# Patient Record
Sex: Male | Born: 1950
Health system: Southern US, Community
[De-identification: ages and names within clinical notes are randomized; demographics above are authoritative.]

## PROBLEM LIST (undated history)

## (undated) DIAGNOSIS — C44301 Unspecified malignant neoplasm of skin of nose: Secondary | ICD-10-CM

## (undated) DIAGNOSIS — I10 Essential (primary) hypertension: Secondary | ICD-10-CM

## (undated) DIAGNOSIS — K209 Esophagitis, unspecified without bleeding: Secondary | ICD-10-CM

## (undated) DIAGNOSIS — E785 Hyperlipidemia, unspecified: Secondary | ICD-10-CM

## (undated) DIAGNOSIS — M1990 Unspecified osteoarthritis, unspecified site: Secondary | ICD-10-CM

## (undated) DIAGNOSIS — D126 Benign neoplasm of colon, unspecified: Secondary | ICD-10-CM

## (undated) DIAGNOSIS — K219 Gastro-esophageal reflux disease without esophagitis: Secondary | ICD-10-CM

## (undated) DIAGNOSIS — K760 Fatty (change of) liver, not elsewhere classified: Secondary | ICD-10-CM

## (undated) DIAGNOSIS — E7800 Pure hypercholesterolemia, unspecified: Secondary | ICD-10-CM

## (undated) HISTORY — DX: Unspecified osteoarthritis, unspecified site: M19.90

## (undated) HISTORY — DX: Esophagitis, unspecified: K20.9

## (undated) HISTORY — PX: COLONOSCOPY: SHX174

## (undated) HISTORY — DX: Fatty (change of) liver, not elsewhere classified: K76.0

## (undated) HISTORY — DX: Hyperlipidemia, unspecified: E78.5

## (undated) HISTORY — DX: Pure hypercholesterolemia, unspecified: E78.00

## (undated) HISTORY — DX: Esophagitis, unspecified without bleeding: K20.90

## (undated) HISTORY — PX: OTHER SURGICAL HISTORY: SHX169

## (undated) HISTORY — PX: ENDOSCOPIC LUMBAR DISCECTOMY W/ LASER: SUR438

## (undated) HISTORY — DX: Benign neoplasm of colon, unspecified: D12.6

## (undated) HISTORY — DX: Unspecified malignant neoplasm of skin of nose: C44.301

## (undated) HISTORY — PX: KNEE SURGERY: SHX244

## (undated) HISTORY — DX: Essential (primary) hypertension: I10

## (undated) HISTORY — DX: Gastro-esophageal reflux disease without esophagitis: K21.9

---

## 1973-12-09 HISTORY — PX: WISDOM TOOTH EXTRACTION: SHX21

## 2000-12-09 HISTORY — PX: BACK SURGERY: SHX140

## 2000-12-09 HISTORY — PX: OTHER SURGICAL HISTORY: SHX169

## 2000-12-29 ENCOUNTER — Encounter: Payer: Self-pay | Admitting: Emergency Medicine

## 2000-12-29 ENCOUNTER — Emergency Department (HOSPITAL_COMMUNITY): Admission: EM | Admit: 2000-12-29 | Discharge: 2000-12-29 | Payer: Self-pay | Admitting: Emergency Medicine

## 2001-02-27 ENCOUNTER — Other Ambulatory Visit: Admission: RE | Admit: 2001-02-27 | Discharge: 2001-02-27 | Payer: Self-pay | Admitting: Gastroenterology

## 2001-02-27 ENCOUNTER — Encounter (INDEPENDENT_AMBULATORY_CARE_PROVIDER_SITE_OTHER): Payer: Self-pay | Admitting: Specialist

## 2001-07-29 ENCOUNTER — Encounter: Payer: Self-pay | Admitting: Orthopedic Surgery

## 2001-07-29 ENCOUNTER — Ambulatory Visit (HOSPITAL_COMMUNITY): Admission: RE | Admit: 2001-07-29 | Discharge: 2001-07-29 | Payer: Self-pay | Admitting: Orthopedic Surgery

## 2001-08-19 ENCOUNTER — Observation Stay (HOSPITAL_COMMUNITY): Admission: RE | Admit: 2001-08-19 | Discharge: 2001-08-20 | Payer: Self-pay | Admitting: Orthopedic Surgery

## 2001-08-19 ENCOUNTER — Encounter (INDEPENDENT_AMBULATORY_CARE_PROVIDER_SITE_OTHER): Payer: Self-pay | Admitting: Specialist

## 2001-08-19 ENCOUNTER — Encounter: Payer: Self-pay | Admitting: Orthopedic Surgery

## 2001-12-16 ENCOUNTER — Encounter: Admission: RE | Admit: 2001-12-16 | Discharge: 2002-03-16 | Payer: Self-pay | Admitting: Internal Medicine

## 2002-04-27 ENCOUNTER — Encounter: Admission: RE | Admit: 2002-04-27 | Discharge: 2002-07-26 | Payer: Self-pay | Admitting: Internal Medicine

## 2005-04-26 ENCOUNTER — Ambulatory Visit: Payer: Self-pay | Admitting: Internal Medicine

## 2005-04-30 ENCOUNTER — Ambulatory Visit: Payer: Self-pay | Admitting: Internal Medicine

## 2005-05-02 ENCOUNTER — Ambulatory Visit: Payer: Self-pay | Admitting: Cardiology

## 2005-05-09 ENCOUNTER — Ambulatory Visit: Payer: Self-pay | Admitting: Gastroenterology

## 2005-05-10 ENCOUNTER — Ambulatory Visit: Payer: Self-pay | Admitting: Cardiology

## 2005-05-17 ENCOUNTER — Ambulatory Visit: Payer: Self-pay | Admitting: Gastroenterology

## 2005-05-17 ENCOUNTER — Encounter (INDEPENDENT_AMBULATORY_CARE_PROVIDER_SITE_OTHER): Payer: Self-pay | Admitting: Specialist

## 2005-05-17 ENCOUNTER — Ambulatory Visit (HOSPITAL_COMMUNITY): Admission: RE | Admit: 2005-05-17 | Discharge: 2005-05-17 | Payer: Self-pay | Admitting: Gastroenterology

## 2005-05-31 ENCOUNTER — Ambulatory Visit: Payer: Self-pay | Admitting: Physical Medicine & Rehabilitation

## 2005-05-31 ENCOUNTER — Inpatient Hospital Stay (HOSPITAL_COMMUNITY): Admission: RE | Admit: 2005-05-31 | Discharge: 2005-06-05 | Payer: Self-pay | Admitting: Orthopedic Surgery

## 2005-11-27 ENCOUNTER — Inpatient Hospital Stay (HOSPITAL_COMMUNITY): Admission: RE | Admit: 2005-11-27 | Discharge: 2005-12-02 | Payer: Self-pay | Admitting: Orthopedic Surgery

## 2007-06-09 ENCOUNTER — Ambulatory Visit: Payer: Self-pay | Admitting: Gastroenterology

## 2008-03-04 ENCOUNTER — Ambulatory Visit: Payer: Self-pay | Admitting: Cardiology

## 2008-03-04 LAB — CONVERTED CEMR LAB
BUN: 14 mg/dL (ref 6–23)
Calcium: 8.6 mg/dL (ref 8.4–10.5)
Cholesterol: 110 mg/dL (ref 0–200)
Creatinine, Ser: 1.2 mg/dL (ref 0.4–1.5)
GFR calc Af Amer: 80 mL/min
Glucose, Bld: 98 mg/dL (ref 70–99)
HDL: 16.9 mg/dL — ABNORMAL LOW (ref >39.0)
LDL Cholesterol: 54 mg/dL (ref 0–99)
Potassium: 4 meq/L (ref 3.5–5.1)
Sodium: 140 meq/L (ref 135–145)
Total Bilirubin: 0.9 mg/dL (ref 0.3–1.2)
Total CHOL/HDL Ratio: 6.5
VLDL: 39 mg/dL (ref 0–40)

## 2008-03-09 ENCOUNTER — Ambulatory Visit: Payer: Self-pay | Admitting: Cardiology

## 2008-04-08 DIAGNOSIS — K209 Esophagitis, unspecified without bleeding: Secondary | ICD-10-CM | POA: Insufficient documentation

## 2008-04-08 DIAGNOSIS — E785 Hyperlipidemia, unspecified: Secondary | ICD-10-CM | POA: Insufficient documentation

## 2008-04-08 DIAGNOSIS — K219 Gastro-esophageal reflux disease without esophagitis: Secondary | ICD-10-CM | POA: Insufficient documentation

## 2008-04-08 DIAGNOSIS — D126 Benign neoplasm of colon, unspecified: Secondary | ICD-10-CM | POA: Insufficient documentation

## 2008-09-11 ENCOUNTER — Emergency Department (HOSPITAL_COMMUNITY): Admission: EM | Admit: 2008-09-11 | Discharge: 2008-09-11 | Payer: Self-pay | Admitting: Family Medicine

## 2009-03-30 DIAGNOSIS — M1990 Unspecified osteoarthritis, unspecified site: Secondary | ICD-10-CM | POA: Insufficient documentation

## 2009-03-30 DIAGNOSIS — I10 Essential (primary) hypertension: Secondary | ICD-10-CM | POA: Insufficient documentation

## 2009-04-03 ENCOUNTER — Ambulatory Visit: Payer: Self-pay | Admitting: Cardiology

## 2009-04-03 DIAGNOSIS — I495 Sick sinus syndrome: Secondary | ICD-10-CM | POA: Insufficient documentation

## 2009-04-10 ENCOUNTER — Encounter: Payer: Self-pay | Admitting: Internal Medicine

## 2009-04-10 ENCOUNTER — Ambulatory Visit: Payer: Self-pay | Admitting: Cardiology

## 2009-05-01 ENCOUNTER — Encounter: Payer: Self-pay | Admitting: Internal Medicine

## 2009-06-16 ENCOUNTER — Ambulatory Visit: Payer: Self-pay | Admitting: Cardiology

## 2009-06-21 ENCOUNTER — Ambulatory Visit: Payer: Self-pay | Admitting: Internal Medicine

## 2009-06-23 ENCOUNTER — Encounter (INDEPENDENT_AMBULATORY_CARE_PROVIDER_SITE_OTHER): Payer: Self-pay | Admitting: *Deleted

## 2009-06-23 LAB — CONVERTED CEMR LAB
Alkaline Phosphatase: 77 units/L (ref 39–117)
Direct LDL: 71.4 mg/dL
TSH: 0.8 microintl units/mL (ref 0.35–5.50)
Total Bilirubin: 1 mg/dL (ref 0.3–1.2)
Total CHOL/HDL Ratio: 6

## 2009-10-26 ENCOUNTER — Ambulatory Visit: Payer: Self-pay | Admitting: Cardiology

## 2009-10-30 ENCOUNTER — Ambulatory Visit: Payer: Self-pay | Admitting: Internal Medicine

## 2009-10-30 LAB — CONVERTED CEMR LAB
ALT: 56 units/L — ABNORMAL HIGH (ref 0–53)
BUN: 17 mg/dL (ref 6–23)
Bilirubin, Direct: 0.1 mg/dL (ref 0.0–0.3)
CO2: 29 meq/L (ref 19–32)
Chloride: 104 meq/L (ref 96–112)
Cholesterol: 146 mg/dL (ref 0–200)
Direct LDL: 75.1 mg/dL
GFR calc non Af Amer: 72.84 mL/min (ref >60)
Glucose, Bld: 98 mg/dL (ref 70–99)
HDL: 24.5 mg/dL — ABNORMAL LOW (ref >39.00)
Potassium: 4.3 meq/L (ref 3.5–5.1)
TSH: 1.18 microintl units/mL (ref 0.35–5.50)
Total Protein: 8.1 g/dL (ref 6.0–8.3)

## 2009-12-04 ENCOUNTER — Telehealth (INDEPENDENT_AMBULATORY_CARE_PROVIDER_SITE_OTHER): Payer: Self-pay | Admitting: *Deleted

## 2010-01-19 ENCOUNTER — Ambulatory Visit: Payer: Self-pay | Admitting: Cardiology

## 2010-01-20 LAB — CONVERTED CEMR LAB
Bilirubin, Direct: 0.1 mg/dL (ref 0.0–0.3)
Cholesterol: 161 mg/dL (ref 0–200)
HDL: 28.3 mg/dL — ABNORMAL LOW (ref >39.00)
Total CHOL/HDL Ratio: 6
Total Protein: 7.5 g/dL (ref 6.0–8.3)

## 2010-01-22 ENCOUNTER — Ambulatory Visit: Payer: Self-pay | Admitting: Cardiology

## 2010-02-16 ENCOUNTER — Ambulatory Visit: Payer: Self-pay | Admitting: Cardiology

## 2010-02-16 ENCOUNTER — Encounter (INDEPENDENT_AMBULATORY_CARE_PROVIDER_SITE_OTHER): Payer: Self-pay | Admitting: *Deleted

## 2010-02-16 LAB — CONVERTED CEMR LAB
BUN: 19 mg/dL
CO2: 32 meq/L
Calcium: 9.3 mg/dL
Chloride: 105 meq/L
Cholesterol: 163 mg/dL
Creatinine, Ser: 1 mg/dL
GFR calc non Af Amer: 81.22 mL/min
Glucose, Bld: 102 mg/dL — ABNORMAL HIGH
HDL: 32.2 mg/dL — ABNORMAL LOW
LDL Cholesterol: 103 mg/dL — ABNORMAL HIGH
Potassium: 4.2 meq/L
Sodium: 140 meq/L
TSH: 1.29 u[IU]/mL
Total CHOL/HDL Ratio: 5
Triglycerides: 140 mg/dL
VLDL: 28 mg/dL

## 2010-02-19 ENCOUNTER — Ambulatory Visit: Payer: Self-pay | Admitting: Internal Medicine

## 2010-05-17 ENCOUNTER — Encounter (HOSPITAL_BASED_OUTPATIENT_CLINIC_OR_DEPARTMENT_OTHER): Admission: RE | Admit: 2010-05-17 | Discharge: 2010-07-24 | Payer: Self-pay | Admitting: Internal Medicine

## 2010-05-18 ENCOUNTER — Ambulatory Visit: Payer: Self-pay | Admitting: Cardiology

## 2010-05-21 ENCOUNTER — Ambulatory Visit: Payer: Self-pay | Admitting: Internal Medicine

## 2010-05-21 LAB — CONVERTED CEMR LAB
ALT: 19 units/L (ref 0–53)
AST: 19 units/L (ref 0–37)
Bilirubin, Direct: 0.1 mg/dL (ref 0.0–0.3)
Cholesterol: 165 mg/dL (ref 0–200)
Direct LDL: 87.8 mg/dL
Total Bilirubin: 0.7 mg/dL (ref 0.3–1.2)
Total CHOL/HDL Ratio: 6
Total Protein: 6.7 g/dL (ref 6.0–8.3)

## 2010-08-17 ENCOUNTER — Ambulatory Visit: Payer: Self-pay | Admitting: Cardiology

## 2010-08-17 LAB — CONVERTED CEMR LAB
AST: 23 units/L (ref 0–37)
Albumin: 3.9 g/dL (ref 3.5–5.2)
Direct LDL: 81 mg/dL
Total Bilirubin: 0.8 mg/dL (ref 0.3–1.2)
Triglycerides: 322 mg/dL — ABNORMAL HIGH (ref 0.0–149.0)
VLDL: 64.4 mg/dL — ABNORMAL HIGH (ref 0.0–40.0)

## 2010-08-23 ENCOUNTER — Ambulatory Visit: Payer: Self-pay | Admitting: Cardiology

## 2010-09-28 ENCOUNTER — Telehealth: Payer: Self-pay | Admitting: Cardiology

## 2010-11-19 ENCOUNTER — Ambulatory Visit: Payer: Self-pay | Admitting: Internal Medicine

## 2010-11-21 LAB — CONVERTED CEMR LAB
Albumin: 3.8 g/dL (ref 3.5–5.2)
Alkaline Phosphatase: 62 units/L (ref 39–117)
Cholesterol: 163 mg/dL (ref 0–200)
Direct LDL: 75.2 mg/dL
Total Bilirubin: 0.4 mg/dL (ref 0.3–1.2)
Total CHOL/HDL Ratio: 6
Triglycerides: 263 mg/dL — ABNORMAL HIGH (ref 0.0–149.0)
VLDL: 52.6 mg/dL — ABNORMAL HIGH (ref 0.0–40.0)

## 2010-11-22 ENCOUNTER — Ambulatory Visit: Payer: Self-pay | Admitting: Internal Medicine

## 2011-01-03 ENCOUNTER — Telehealth: Payer: Self-pay | Admitting: Gastroenterology

## 2011-01-06 LAB — CONVERTED CEMR LAB
CO2: 32 meq/L (ref 19–32)
Calcium: 9.2 mg/dL (ref 8.4–10.5)
Chloride: 104 meq/L (ref 96–112)
Creatinine, Ser: 1.1 mg/dL (ref 0.4–1.5)
GFR calc non Af Amer: 72.98 mL/min (ref >60)
Glucose, Bld: 85 mg/dL (ref 70–99)
HDL: 21.1 mg/dL — ABNORMAL LOW (ref >39.00)
Potassium: 3.9 meq/L (ref 3.5–5.1)
Sodium: 141 meq/L (ref 135–145)
Total CHOL/HDL Ratio: 8
VLDL: 75.6 mg/dL — ABNORMAL HIGH (ref 0.0–40.0)

## 2011-01-08 NOTE — Assessment & Plan Note (Signed)
Summary: rov/sp   Lipid Clinic Visit      The patient comes in today for dyslipidemia follow-up.  The patient has no complaints of chest pain, muscle aches, and muscle cramps.  He currently takes fish oil 2 capsules three times daily for his cholesterol.   His breakfast usually consists of cheerios and skim milk.  His lunch varies but he does report eating vegetable soup or a pimento cheese sandwich.  For dinner he has started eating with a neighbor who makes home cooked meals for him, which generally consist of starches and fried foods.  He usually only drinks water, green tea or gatorade.   Review of exercise habits reveals that the patient is started mountain biking about 8 miles every day approximately 10 days ago.    Lipid Management Provider  Weston Brass, PharmD and Kennieth Francois, PharmD Candidate  Current Medications (verified): 1)  Folic Acid 1 Mg Tabs (Folic Acid) .Marland Kitchen.. 1 By Mouth Once Daily 2)  Lisinopril 40 Mg Tabs (Lisinopril) .... Take One Tablet By Mouth Daily 3)  Hydrochlorothiazide 12.5 Mg Tabs (Hydrochlorothiazide) .... Take One Tablet By Mouth Daily. 4)  Omeprazole 40 Mg Cpdr (Omeprazole) .Marland Kitchen.. 1 Tab Once Daily 5)  Aspirin 81 Mg Tbec (Aspirin) .... Take One Tablet By Mouth Daily 6)  Fish Oil Concentrate 1000 Mg Caps (Omega-3 Fatty Acids) .... 4-6 Grams / Day With Food For Cholesterol.  Allergies: 1)  Niaspan (Niacin (Antihyperlipidemic))   Vital Signs:  Patient profile:   60 year old male Weight:      255.50 pounds BMI:     34.78 Pulse rate:   64 / minute Pulse rhythm:   regular BP sitting:   112 / 74  (right arm) Cuff size:   large  Nutrition Counseling: Patient's BMI is greater than 25 and therefore counseled on weight management options.  Impression & Recommendations:  Problem # 1:  HYPERLIPIDEMIA (ICD-272.4) Assessment Unchanged Long-standing history of hyperlipidemia  currently below TC goal of 200 and LDL goal of 100.  Triglycerides are elevated (322) due to  eating with neighbor at dinner and eating fried foods and starches.  Discussed addition of fenofibrate versus lifestyle changes to lower TG.  Pt prefers to attempt lifestyle changes.  Continue all medications as directed.  Recently started mountain biking 10 days ago about 8 miles per day.  Will continue this daily.  Counseled patient on limiting starches and fried foods, as well as red meats, and increasing intake of high fiber vegetables and grilled meat.  Goal is to lose 25 pounds by December 2011.  Further medications such as fenofibrate will be considered at next visit if patient is unable to bring triglycerides down.   Seen with: Kennieth Francois, PharmD Candidate  Patient Instructions: 1)  It was nice to meet you! 2)  Continue taking all your medications as prescribed. 3)  Try to limit your starches and carbohydrates (corn, peas, potatoes, pasta, rice) and increase your intake of high fiber vegetables (squash, broccoli, green beans).  Limit fried foods and red meats.   4)  Goal is to lose 25 lbs by December 2011. 5)  Continue mountain biking/exercising daily. 6)  Your appointment to draw your next labs December 12th at 8:45. 7)  Your follow-up appointment for your lipid results is December 15th at 3:30.

## 2011-01-08 NOTE — Assessment & Plan Note (Signed)
Summary: rov lippid - [facility]   [name] returns to lipid clinic today.  He states he is feeling and doing well overall.  He denies muscle aches/pains/weakness and fatigue at this time.  He states that he has been working to return to his normal dietary intake following low-fat / low-cholesterol foods.  Exercise is limited by the patient's traveling, which incidentally, limits dietary compliance.  He has not gotten back to cycling due to weather.  He states that he lost weight down to 255 pounds, and is somewhat disappointed that he now weighs today at 260 pounds.    Lipid Management Provider  Shelby Dubin, PharmD, BCPS, CPP  Current Medications (verified): 1)  Folic Acid 1 Mg Tabs (Folic Acid) .Marland Kitchen.. 1 By Mouth Once Daily 2)  Lisinopril 40 Mg Tabs (Lisinopril) .... Take One Tablet By Mouth Daily 3)  Hydrochlorothiazide 12.5 Mg Tabs (Hydrochlorothiazide) .... Take One Tablet By Mouth Daily. 4)  Omeprazole 40 Mg Cpdr (Omeprazole) .Marland Kitchen.. 1 Tab Once Daily 5)  Aspirin 81 Mg Tbec (Aspirin) .... Take One Tablet By Mouth Daily 6)  Fish Oil Concentrate 1000 Mg Caps (Omega-3 Fatty Acids) .Marland Kitchen.. 1 - 4 Grams / Day With Food For Cholesterol.  Allergies (verified): 1)  Niaspan (Niacin (Antihyperlipidemic))  Past History:  Past Medical History: Last updated: 03/30/2009 Current Problems:  OSTEOARTHRITIS (ICD-715.90) HYPERTENSION (ICD-401.9) ESOPHAGITIS (ICD-530.10) COLONIC POLYPS, HYPERPLASTIC (ICD-211.3) GERD (ICD-530.81) HYPERLIPIDEMIA (ICD-272.4)  Postoperative cellulitis.  Past Surgical History: Last updated: 03/30/2009  right total knee arthroplasty Lumbar discectomy in 2002.  Arthroscopic scopes in the 80s. Open knee cartilage repair in the 70s.  Wisdom tooth in 1975. microdiskectomy on his lumbar spine in 2002.  Family History: Last updated: 02/03/10 Mother deceased with a history of an enlarged  heart, hypertension, diabetes, and colon cancer.  Grandfather deceased with  colon  cancer.  Risk Factors: Alcohol Use: 0 (10/30/2009)  Family History: Mother deceased with a history of an enlarged  heart, hypertension, diabetes, and colon cancer.  Grandfather deceased with  colon cancer.   Vital Signs:  Patient profile:   60 year old male Height:      72 inches Weight:      260 pounds Resp:     16 per minute  Impression & Recommendations:  Problem # 1:  HYPERLIPIDEMIA (ICD-272.4) Patient has been compliant with fish oil therapy since our last visit.  Lab values reflect total cholesterol is 161, triglycerides 419 (carbs intake has been increased in past month per patient), HDL 28.3, Direct LDL 72.1.  LFTs are within normal limits.  Patient seems resistant to initiating fibric acid derivatives or statin today.  He believes that his dietary indiscretions are responsible for TG elevation.  He requests 1 month to lower on "his own", and will increase fish oil to 2 grams two times a day in the meantime.  In one month, he will agree to additional therapy if not improved.  I have reviewed risks/benefits with patient.  I appreciate the opportunity to see him.

## 2011-01-08 NOTE — Assessment & Plan Note (Signed)
Summary: rov/tm   Visit Type:  Follow-up  CC:  dyslipidemia follow-up.   Lipid Clinic Visit      The patient comes in today for dyslipidemia follow-up.  The patient has no complaints of chest pain, muscle aches, and muscle cramps.  He currently takes fish oil 4gm daily for his cholesterol.   Dietary compliance review reveals pt does eat a low-fat, low-carbohydrate most often but does sometimes stray away from this.  His breakfast usually consists of cheerios and skim milk.  His lunch varies but he does report eating fast food on occassion.  For dinner he has started eating with a neighbor who makes home cooked meals for him.  He usually only drinks water, green tea or gatorade.   Review of exercise habits reveals that the patient is not exercising currently.  Prior to his last lipid clinic appt, he was mountain biking around 3 -5 times a week for about an hour.  He has not done this since April due to time constraints and family responsibilities.     Lipid Management Provider  Weston Brass, PharmD  Allergies: 1)  Niaspan (Niacin (Antihyperlipidemic))   Vital Signs:  Patient profile:   60 year old male Weight:      253 pounds Pulse rate:   70 / minute BP sitting:   124 / 84  (right arm) Cuff size:   regular  Impression & Recommendations:  Problem # 1:  HYPERLIPIDEMIA (ICD-272.4) Assessment Deteriorated Since last visit, pt's cholesterol has deteriorated.  His TG have increased to 246, HDL decreased to 25, but his LDL did improve to 87.8.  Pt reports the biggest change since last visit was lack of exercise.  When looking back at his flowsheet, when he exercises, his TG are lower and HDL is higher.  Encouraged him to restart mountain biking most days of the week.  Asked him to set a weight loss goal of 10 lbs before the next visit.  We will continue fish oil 4 gm daily.  If his TG are still elevated despite exercise, will consider addition of fibrate.  Will recheck his cholesterol in 3  months.   Patient Instructions: 1)  Continue fish oil 4 capsules daily 2)  Continue low fat, low cholesterol diet 3)  Start riding your mountain bike 4-5 days a week for 1 hour 4)  Goal: 10 lb weight loss by next visit.  5)  Labs at [facility]: 9/9 at 8:30 (fasting) 6)  Lipid Clinic Appt: 9/12 at 3:30

## 2011-01-08 NOTE — Letter (Signed)
Summary: Custom - Lipid  [facility], Main Office  [address]   [place], [place] [address]   Phone: [phone]  Fax: [phone]     February 16, 2010 MRN: [id number]   [facility] [address] [place], [place]  [address]   Dear Mr. Kilroy,  We have reviewed your cholesterol results.  They are as follows:     Total Cholesterol:    163 (Desirable: less than 200)       HDL  Cholesterol:     32.20  (Desirable: greater than 40 for men and 50 for women)       LDL Cholesterol:       103  (Desirable: less than 100 for low risk and less than 70 for moderate to high risk)       Triglycerides:       140.0  (Desirable: less than 150)  Our recommendations include:These numbers look good. Continue on the same medicine. Sodium, potassium, kidney and thyroid  function are normal. Take care, Dr. Darel Hong.    Call our office at the number listed above if you have any questions.  Lowering your LDL cholesterol is important, but it is only one of a large number of "risk factors" that may indicate that you are at risk for heart disease, stroke or other complications of hardening of the arteries.  Other risk factors include:   A.  Cigarette Smoking* B.  High Blood Pressure* C.  Obesity* D.   Low HDL Cholesterol (see yours above)* E.   Diabetes Mellitus (higher risk if your is uncontrolled) F.  Family history of premature heart disease G.  Previous history of stroke or cardiovascular disease    *These are risk factors YOU HAVE CONTROL OVER.  For more information, visit .  There is now evidence that lowering the TOTAL CHOLESTEROL AND LDL CHOLESTEROL can reduce the risk of heart disease.  The American Heart Association recommends the following guidelines for the treatment of elevated cholesterol:  1.  If there is now current heart disease and less than two risk factors, TOTAL CHOLESTEROL should be less than 200 and LDL CHOLESTEROL should be less than 100. 2.   If there is current heart disease or two or more risk factors, TOTAL CHOLESTEROL should be less than 200 and LDL CHOLESTEROL should be less than 70.  A diet low in cholesterol, saturated fat, and calories is the cornerstone of treatment for elevated cholesterol.  Cessation of smoking and exercise are also important in the management of elevated cholesterol and preventing vascular disease.  Studies have shown that 30 to 60 minutes of physical activity most days can help lower blood pressure, lower cholesterol, and keep your weight at a healthy level.  Drug therapy is used when cholesterol levels do not respond to therapeutic lifestyle changes (smoking cessation, diet, and exercise) and remains unacceptably high.  If medication is started, it is important to have you levels checked periodically to evaluate the need for further treatment options.  Thank you,    [employer] Team

## 2011-01-08 NOTE — Progress Notes (Signed)
Summary: refill request   Phone Note Refill Request Message from:  Patient on September 28, 2010 2:11 PM  Refills Requested: Medication #1:  LISINOPRIL 40 MG TABS Take one tablet by mouth daily  Medication #2:  HYDROCHLOROTHIAZIDE 12.5 MG TABS Take one tablet by mouth daily.  Medication #3:  OMEPRAZOLE 40 MG CPDR 1 tab once daily  Medication #4:  FOLIC ACID 1 MG TABS 1 by mouth once daily folic acid should be 1 twice a day-[facility] [address]   Method Requested: Telephone to Pharmacy Initial call taken by: Glynda Jaeger,  September 28, 2010 2:13 PM  Follow-up for Phone Call        only refilling cardiac meds...Marland KitchenMarland KitchenTried calling pt back but he was not in his office will try to call again to let him know he needs to get folic acid over the counter ask primary md for omeprazole Follow-up by: Kem Parkinson,  September 28, 2010 2:34 PM    Prescriptions: HYDROCHLOROTHIAZIDE 12.5 MG TABS (HYDROCHLOROTHIAZIDE) Take one tablet by mouth daily.  #30 x 12   Entered by:   Kem Parkinson   Authorized by:   Ferman Hamming, MD, [facility]   Signed by:   Kem Parkinson on 09/28/2010   Method used:   [facility]  [address] [id number]       [address]       [place], [place]  [address]       Ph: [phone] or [phone]       Fax: [phone]   RxID:   [id number] LISINOPRIL 40 MG TABS (LISINOPRIL) Take one tablet by mouth daily  #30 x 12   Entered by:   Kem Parkinson   Authorized by:   Ferman Hamming, MD, [facility]   Signed by:   Kem Parkinson on 09/28/2010   Method used:   [facility]  [address] [id number]       [address]       [place], [place]  [address]       Ph: [phone] or [phone]       Fax: [phone]   RxID:   [id number]

## 2011-01-09 ENCOUNTER — Telehealth: Payer: Self-pay | Admitting: Gastroenterology

## 2011-01-10 NOTE — Assessment & Plan Note (Signed)
Summary: rov/sp    Lipid Clinic Visit      Pleasant pt comes in today for dyslipidemia follow-up.  He has no complaints of chest pain, muscle aches, and muscle cramps.  He currently takes fish oil 2 capsules three times daily for his cholesterol.   Pt has been striving for weight loss and had increased exercise at last visit by mountain biking daily. Today pt reports that he has been traveling a lot the past couple of months and has not been able to exercise. Additionally he reports he has been eating out for all meals while traveling.  Pt acknowledges his diet and exercise have deteriorated since his last visit, but he still has the same goals as previously.   Lipid Management Provider  Reina Fuse, PharmD  Preventive Screening-Counseling & Management  Alcohol-Tobacco     Smoking Status: quit     Year Quit: 2002  Caffeine-Diet-Exercise     Caffeine use/day: 6 cups/day  Allergies: 1)  Niaspan (Niacin (Antihyperlipidemic))  Social History: Smoking Status:  quit Caffeine use/day:  6 cups/day   Vital Signs:  Patient profile:   60 year old male Height:      72 inches Weight:      258.75 pounds BMI:     35.22 BP sitting:   110 / 78  (left arm)  Impression & Recommendations:  Problem # 1:  HYPERLIPIDEMIA (ICD-272.4) Assessment Improved TG improved since last visit but still not at goal. Lipid panel reveals: TC 163 (Goal<200), TG 263 (Goal<150), HDL 28.5 (Goal>40), LDL 75.2 (Goal<100). Continue taking fish oil 4-6 caps (4000-6000 mg) daily. Pt acknowledges diet and exercise have deteriorated since last visit; however, he still has the same weight loss and health goals. Pt to begin mountain biking again once travel slows down in February. Provided pt with Food in the [employer] and discussed choosing healthier options while eating out. Recommend substituting subs/sandwiches for burgers and fries. Pt would like to continue diet and exercise to control TG and HDL rather than take  medications. Pt to f/u with lipid clinic in 6 months.   Patient Instructions: 1)  1.) Continue taking fish oil 4-6 capsules daily. 2)  2.) Try to maker healthier food choices while traveling. 3)  3.) Continue weight loss goal of 10 lbs by next visit (goal 25 lbs) 4)  4.) Start back to mountain back as travel will allow. 5)  5.) Return to clinic in 6 months.

## 2011-01-10 NOTE — Progress Notes (Signed)
Summary: refills  Medications Added OMEPRAZOLE 40 MG CPDR (OMEPRAZOLE) 1 tab once daily       Phone Note Call from Patient Call back at Home Phone [phone]   Caller: Patient Call For: Dr Arlyce Dice Reason for Call: Refill Medication, Talk to Nurse Summary of Call: Partient needs refills for Omeprazole [facility] [address] Initial call taken by: Tawni Levy,  January 03, 2011 11:10 AM  Follow-up for Phone Call        Dr Arlyce Dice, This pt has not been seen since 2008. Can he have his refills? Follow-up by: Merri Ray CMA Duncan Dull),  January 03, 2011 2:57 PM  Additional Follow-up for Phone Call Additional follow up Details #1::        ok but recommend OV Additional Follow-up by: Louis Meckel MD,  January 03, 2011 5:16 PM    Additional Follow-up for Phone Call Additional follow up Details #2::    Called pt to inform that he needs to call us back to schedule an office appointment before any further refills. L/M for pt. Follow-up by: Merri Ray CMA Duncan Dull),  January 04, 2011 9:46 AM  New/Updated Medications: OMEPRAZOLE 40 MG CPDR (OMEPRAZOLE) 1 tab once daily Prescriptions: OMEPRAZOLE 40 MG CPDR (OMEPRAZOLE) 1 tab once daily  #3 x 0   Entered by:   Merri Ray CMA (AAMA)   Authorized by:   Louis Meckel MD   Signed by:   Merri Ray CMA (AAMA) on 01/04/2011   Method used:   [facility]  [address] [id number]       [address]       [place], [place]  [address]       Ph: [phone] or [phone]       Fax: [phone]   RxID:   [id number]

## 2011-01-16 ENCOUNTER — Encounter (INDEPENDENT_AMBULATORY_CARE_PROVIDER_SITE_OTHER): Payer: Self-pay | Admitting: *Deleted

## 2011-01-16 NOTE — Progress Notes (Signed)
Summary: ? re appt   Phone Note Call from Patient Call back at Home Phone [phone]   Caller: Patient Call For: Dr Arlyce Dice Reason for Call: Talk to Nurse Summary of Call: Patient was told to sch an appt before he can get more refills but theres nothing available. Can he get worked in or can he get refills until the appt calendar comes out. Initial call taken by: Tawni Levy,  January 09, 2011 11:26 AM  Follow-up for Phone Call        Explained to pt that we have no available spots and the schedule is not out for the middle of March yet. Told pt to call back to schedule middle of Feb. Will continue to refill his meds until he can get an appointment Follow-up by: Merri Ray CMA Duncan Dull),  January 09, 2011 4:11 PM

## 2011-01-24 NOTE — Letter (Signed)
Summary: New Patient letter  [facility]  [address] [place], [place] [address]   Phone: [phone]  Fax: [phone]       01/16/2011 MRN: [id number]  [facility] [address] [place], [place]  [address]  Dear [name],  Welcome to the Gastroenterology Division at [facility].    You are scheduled to see Dr.  Melvia Heaps on February 28, 2011 at 10:45am on the 3rd floor at [facility], [address]  We ask that you try to arrive at our office 15 minutes prior to your appointment time to allow for check-in.  We would like you to complete the enclosed self-administered evaluation form prior to your visit and bring it with you on the day of your appointment.  We will review it with you.  Also, please bring a complete list of all your medications or, if you prefer, bring the medication bottles and we will list them.  Please bring your insurance card so that we may make a copy of it.  If your insurance requires a referral to see a specialist, please bring your referral form from your primary care physician.  Co-payments are due at the time of your visit and may be paid by cash, check or credit card.     Your office visit will consist of a consult with your physician (includes a physical exam), any laboratory testing he/she may order, scheduling of any necessary diagnostic testing (e.g. x-ray, ultrasound, CT-scan), and scheduling of a procedure (e.g. Endoscopy, Colonoscopy) if required.  Please allow enough time on your schedule to allow for any/all of these possibilities.    If you cannot keep your appointment, please call [phone] to cancel or reschedule prior to your appointment date.  This allows [place] the opportunity to schedule an appointment for another patient in need of care.  If you do not cancel or reschedule by 5 p.m. the business day prior to your appointment date, you will be charged a $50.00 late cancellation/no-show fee.    Thank  you for choosing [facility] for your medical needs.  We appreciate the opportunity to care for you.  Please visit [place] at our website  to learn more about our practice.                     Sincerely,                                                             The Gastroenterology Division

## 2011-01-30 ENCOUNTER — Telehealth: Payer: Self-pay | Admitting: Gastroenterology

## 2011-02-05 NOTE — Progress Notes (Signed)
Summary: rx refill  Medications Added OMEPRAZOLE 40 MG CPDR (OMEPRAZOLE) 1 tab once daily       Phone Note Call from Patient Call back at Home Phone [phone]   Caller: Patient Call For: Dr Arlyce Dice Reason for Call: Refill Medication, Talk to Nurse Summary of Call: Patient needs another rx for Omeprazole [facility] Initial call taken by: Tawni Levy,  January 30, 2011 9:56 AM  Follow-up for Phone Call        Medication sent to pharmacy Follow-up by: Merri Ray CMA Duncan Dull),  January 30, 2011 11:33 AM    New/Updated Medications: OMEPRAZOLE 40 MG CPDR (OMEPRAZOLE) 1 tab once daily Prescriptions: OMEPRAZOLE 40 MG CPDR (OMEPRAZOLE) 1 tab once daily  #30 x 1   Entered by:   Merri Ray CMA (AAMA)   Authorized by:   Louis Meckel MD   Signed by:   Merri Ray CMA (AAMA) on 01/30/2011   Method used:   [facility]  [address] [id number]       [address]       [place], [place]  [address]       Ph: [phone] or [phone]       Fax: [phone]   RxID:   [id number]

## 2011-02-28 ENCOUNTER — Ambulatory Visit (INDEPENDENT_AMBULATORY_CARE_PROVIDER_SITE_OTHER): Payer: 59 | Admitting: Gastroenterology

## 2011-02-28 ENCOUNTER — Encounter: Payer: Self-pay | Admitting: Gastroenterology

## 2011-02-28 DIAGNOSIS — K209 Esophagitis, unspecified without bleeding: Secondary | ICD-10-CM

## 2011-02-28 DIAGNOSIS — Z800 Family history of malignant neoplasm of digestive organs: Secondary | ICD-10-CM | POA: Insufficient documentation

## 2011-02-28 DIAGNOSIS — D126 Benign neoplasm of colon, unspecified: Secondary | ICD-10-CM

## 2011-02-28 MED ORDER — PEG-KCL-NACL-NASULF-NA ASC-C 100 G PO SOLR: 1.0000 | Freq: Once | ORAL | Status: AC

## 2011-02-28 MED ORDER — OMEPRAZOLE 40 MG PO CPDR: 40.0000 mg | DELAYED_RELEASE_CAPSULE | Freq: Every day | ORAL | Status: DC

## 2011-02-28 NOTE — Assessment & Plan Note (Signed)
Index  polypectomy for adenomatous polyp 2002.   Hyperplastic polyp 2006.   Recommendations #1  colonoscopy

## 2011-02-28 NOTE — Patient Instructions (Addendum)
Colonoscopy A colonoscopy is an exam to evaluate your entire colon. In this exam, your colon is cleansed. A long fiberoptic tube is inserted through your rectum and into your colon. The fiberoptic scope (endoscope) is a long bundle of enclosed and very flexible fibers. These fibers transmit light to the area examined and send images from that area to your caregiver. Discomfort is usually minimal. You may be given a drug to help you sleep (sedative) during or prior to the procedure. This exam helps to detect lumps (tumors), polyps, inflammation, and areas of bleeding. Your caregiver may also take a small piece of tissue (biopsy) that will be examined under a microscope. BEFORE THE PROCEDURE  A clear liquid diet may be required for 2 days before the exam.   Liquid injections (enemas) or laxatives may be required.   A large amount of electrolyte solution may be given to you to drink over a short period of time. This solution is used to clean out your colon.   You should be present one  prior to your procedure or as directed by your caregiver.   Check in at the admissions desk to fill out necessary forms if not preregistered. There will be consent forms to sign prior to the procedure. If accompanied by friends or family, there is a waiting area for them while you are having your procedure.  LET YOUR CAREGIVER KNOW ABOUT:  Allergies to food or medicine.  Medicines taken, including vitamins, herbs, eyedrops, over-the-counter medicines, and creams.   Use of steroids (by mouth or creams).   Previous problems with anesthetics or numbing medicines.   History of bleeding problems or blood clots.  Previous surgery.   Other health problems, including diabetes and kidney problems.   Possibility of pregnancy, if this applies.   AFTER THE PROCEDURE  If you received a sedative and/or pain medicine, you will need to arrange for someone to drive you home.   Occasionally, there is a little blood passed  with the first bowel movement. DO NOT be concerned.  HOME CARE INSTRUCTIONS  It is not unusual to pass moderate amounts of gas and experience mild abdominal cramping following the procedure. This is due to air being used to inflate your colon during the exam. Walking or a warm pack on your belly (abdomen) may help.   You may resume all normal meals and activities after sedatives and medicines have worn off.   Only take over-the-counter or prescription medicines for pain, discomfort, or fever as directed by your caregiver. DO NOT use aspirin or blood thinners if a biopsy was taken. Consult your caregiver for medicine usage if biopsies were taken.  FINDING OUT THE RESULTS OF YOUR TEST Not all test results are available during your visit. If your test results are not back during the visit, make an appointment with your caregiver to find out the results. Do not assume everything is normal if you have not heard from your caregiver or the medical facility. It is important for you to follow up on all of your test results. SEEK IMMEDIATE MEDICAL CARE IF: You pass large blood clots or fill a toilet with blood following the procedure. This may also occur 10 to 14 days following the procedure. This is more likely if a biopsy was taken.   You develop abdominal pain that keeps getting worse and cannot be relieved with medicine.  Document Released: 11/22/2000 Document Re-Released: 02/19/2010 [facility] Patient Information 2011 [place], [place]. Please call back to schedule your colonoscopy  Heartburn Heartburn is a painful, burning sensation in the chest. It may feel worse in certain positions, such as lying down or bending over. It is caused by stomach acid backing up into the tube that carries food from the mouth down to the stomach (lower esophagus).  CAUSES A number of conditions can cause or worsen heartburn, including:   Pregnancy.   Being overweight (obesity).   A condition called hiatal hernia, in  which part or all of the stomach is moved up into the chest through a weakness in the diaphragm muscle.   Alcohol.   Exercise.   Eating just before going to bed.   Overeating.   Medications, including:   Nonsteroidal anti-inflammatory drugs, such as ibuprofen and naproxen.   Aspirin.   Some blood pressure medicines, including beta-blockers, calcium channel blockers, and alpha-blockers.   Nitrates (used to treat angina).   The asthma medication Theophylline.   Certain sedative drugs.   Heartburn may be worse after eating certain foods. These heartburn-causing foods are different for different people, but may include:   Peppers.   Chocolate.   Coffee.   High-fat foods, including fried foods.   Spicy foods.   Garlic, onions.   Citrus fruits, including oranges, grapefruit, lemons and limes.   Food containing tomatoes or tomato products.   Mint.   Carbonated beverages.   Vinegar.  SYMPTOMS  Symptoms may last for a few minutes or a few hours, and can include:  Burning pain in the chest or lower throat.   Bitter taste in the mouth.   Coughing.  DIAGNOSIS If the usual treatments for heartburn do not improve your symptoms, then tests may be done to see if there is another condition present. Possible tests may include:  X-rays.   Endoscopy. This is when a tube with a light and a camera on the end is used to examine the esophagus and the stomach.   Blood, breath, or stool tests may be used to check for bacteria that cause ulcers.  TREATMENT There are a number of non-prescription medicines used to treat heartburn, including:  Antacids.   Acid reducers (also called H-2 blockers).   Proton-pump inhibitors.  HOME CARE INSTRUCTIONS  Raise the head of your bed by putting blocks under the legs.   Eat 2-3 hours before going to bed.   Stop smoking.   Try to reach and maintain a healthy weight.   Do not eat just a few very large meals. Instead, eat many  smaller meals throughout the day.   Try to identify foods and beverages that make your symptoms worse, and avoid these.   Avoid tight clothing.   Do not exercise right after eating.  SEEK IMMEDIATE MEDICAL CARE IF YOU:  Have severe chest pain that goes down your arm, or into your jaw or neck.   Feel sweaty, dizzy, or lightheaded.   Are short of breath.   Throw up (vomit) blood.   Have difficulty or pain with swallowing.   Have bloody or black, tarry stools.   Have bouts of heartburn more than three times a week for more than two weeks.  Document Released: 04/13/2009 Document Re-Released: 02/19/2010 [facility] Patient Information 2011 [place], [place].

## 2011-02-28 NOTE — Assessment & Plan Note (Signed)
Plan colonoscopy every 5 years 

## 2011-02-28 NOTE — Progress Notes (Signed)
History of Present Illness:   [name] is a pleasant, 60 year old white male referred at the request of Dr. Posey Rea  for followup of his GERD.   Symptoms are well-controlled with daily omeprazole. He has recurrent symptoms when he is off the medicines for several days to weeks. Endoscopy in 2007 was unrevealing.   The patient has  a history of colon polyps. Adenomatous polyps were removed in 2002. A hyperplastic polyp was removed 2006. He has no  lower GI complaints, including change of bowel habits, rectal bleeding, or abdominal pain. Family history is pertinent for his mother, who had colon cancer at age 47.    Review of Systems: Pertinent positive and negative review of systems were noted in the above HPI section. All other review of systems were otherwise negative.    Current Medications, Allergies, Past Medical History, Past Surgical History, Family History and Social History were reviewed in [employer] electronic medical record  Vital signs were reviewed in today's medical record. Physical Exam: General: Well developed , well nourished, no acute distress Head: Normocephalic and atraumatic Eyes:  sclerae anicteric, EOMI Ears: Normal auditory acuity Mouth: No deformity or lesions Lungs: Clear throughout to auscultation Heart: Regular rate and rhythm; no murmurs, rubs or bruits Abdomen: Soft, non tender and non distended. No masses, hepatosplenomegaly or hernias noted. Normal Bowel sounds Rectal:deferred Musculoskeletal: Symmetrical with no gross deformities  Pulses:  Normal pulses noted Extremities: No clubbing, cyanosis, edema or deformities noted Neurological: Alert oriented x 4, grossly nonfocal Psychological:  Alert and cooperative. Normal mood and affect

## 2011-02-28 NOTE — Assessment & Plan Note (Signed)
Patient appears to be PPI-dependent.   Recommendations #1 renew omeprazole. #2 I instructed the patient to try taking the omeprazole every other day or every third day if possible

## 2011-03-13 ENCOUNTER — Encounter: Payer: Self-pay | Admitting: *Deleted

## 2011-03-14 ENCOUNTER — Encounter: Payer: Self-pay | Admitting: Gastroenterology

## 2011-03-14 ENCOUNTER — Ambulatory Visit: Payer: 59 | Admitting: Gastroenterology

## 2011-03-14 VITALS — BP 145/88 | HR 54 | Temp 97.2°F | Resp 18 | Ht 72.0 in | Wt 260.0 lb

## 2011-03-14 DIAGNOSIS — Z800 Family history of malignant neoplasm of digestive organs: Secondary | ICD-10-CM

## 2011-03-14 DIAGNOSIS — Z1211 Encounter for screening for malignant neoplasm of colon: Secondary | ICD-10-CM

## 2011-03-14 DIAGNOSIS — Z86010 Personal history of colonic polyps: Secondary | ICD-10-CM

## 2011-03-14 DIAGNOSIS — Z8371 Family history of colonic polyps: Secondary | ICD-10-CM

## 2011-03-14 MED ORDER — SODIUM CHLORIDE 0.9 % IV SOLN: 500.0000 mL | INTRAVENOUS | Status: DC

## 2011-03-14 NOTE — Patient Instructions (Signed)
[employer] reviewed with patient and caregiver.  [facility] Discharge Instructions signed by caregiver.  Normal colonoscopy. Repeat colonoscopy in 5 years due to family history.

## 2011-03-18 ENCOUNTER — Telehealth: Payer: Self-pay | Admitting: *Deleted

## 2011-03-18 NOTE — Telephone Encounter (Signed)
Left message on voicemail.

## 2011-04-22 ENCOUNTER — Other Ambulatory Visit: Payer: Self-pay | Admitting: Internal Medicine

## 2011-04-22 ENCOUNTER — Other Ambulatory Visit: Payer: Self-pay

## 2011-04-22 DIAGNOSIS — E7800 Pure hypercholesterolemia, unspecified: Secondary | ICD-10-CM

## 2011-04-23 NOTE — Assessment & Plan Note (Signed)
[facility]                         GASTROENTEROLOGY OFFICE NOTE   NAME:[name] [name] GOLDRING                MRN:          [id number]  DATE:06/09/2007                            DOB:          [birth year]    PROBLEMS:  Reflux.   HISTORY OF PRESENT ILLNESS:  Mr. [name] [name] has returned for scheduled  follow up.  His reflux is well controlled with daily Protonix.  He  denies dysphagia, abdominal pain or nausea.  He does think that his  abdomen is protruding somewhat on the left side, and he has some  achiness on the left side.  He denies change in bowel habits.   MEDICATIONS:  1. Folate.  2. Lisinopril.  3. HCTZ.  4. Protonix on exam.   PHYSICAL EXAMINATION:  VITAL SIGNS:  Pulse 68, blood pressure 100/52,  weight 260.  ABDOMEN:  Without mass, tenderness or organomegaly.   IMPRESSION:  1. GERD, well controlled with Protonix.  2. No evidence of abdominal mass or organomegaly.   RECOMMENDATIONS:  Renew Protonix.     Barbette Hair. Arlyce Dice, MD,FACG  Electronically Signed    RDK/MedQ  DD: 06/09/2007  DT: 06/10/2007  Job #: [id number]

## 2011-04-23 NOTE — Assessment & Plan Note (Signed)
[facility]                            CARDIOLOGY OFFICE NOTE   NAME:[name], [name]                MRN:          [id number]  DATE:03/09/2008                            DOB:          November 30, 1951    Mr. Chisolm is a pleasant gentleman who has a history of hypertension  and hyperlipidemia.  He did have a Myoview in June 2005 that showed no  ischemia, and his ejection fraction was 69%.  Since I last saw him he is  doing well from a symptomatic standpoint.  He denies any dyspnea, chest  pain, palpitations, or syncope; and there is no claudication.   MEDICATIONS INCLUDE:  1. Folate.  2. Lisinopril 40 mg daily.  3. Hydrochlorothiazide 12.5 mg p.o. daily.  4. Protonix 40 mg daily.   PHYSICAL EXAM:  Shows a blood pressure of 119/78, and his pulse is 57.  His weighs 265 pounds.  HEENT:  Normal.  NECK:  Supple.  No bruits.  CHEST:  Clear.  CARDIOVASCULAR EXAM:  Regular rate.  ABDOMINAL EXAM:  Shows no tenderness.  EXTREMITIES:  Show no edema.   ELECTROCARDIOGRAM:  Shows a sinus rhythm at a rate at 57.  There are no  ST changes noted.   DIAGNOSES:  1. Hypertension. Mr. Taranto' blood pressure is adequately      controlled, and we will continue with his present dose of      lisinopril and HCT.  Note, he did have a BMET on March 04, 2008.      This showed a potassium of 4, and normal renal function.  2. Hyperlipidemia.  His most recent lipids on March 04, 2008, showed a      total cholesterol 110, an LDL of 54, and an HDL of 16.9.  His      triglycerides were 194.  He has discontinued his Niaspan in the      past.  He will resume that at 500 mg p.o. daily and increase every      4 weeks to a total of 2 g daily.  We will then check lipids and      liver and adjust as indicated.  I have asked him to take an aspirin      325 mg p.o. 30 minutes prior to taking his Niaspan.  We also      discussed the importance of diet and exercise.  Note, he  discontinued tobacco use 7 years ago.   I have asked him to follow up with his primary care physician for his  other health care needs.  Otherwise, I will see him back in 1 year.     Madolyn Frieze Jens Som, MD, [facility]  Electronically Signed    BSC/MedQ  DD: 03/09/2008  DT: 03/09/2008  Job #: [id number]

## 2011-04-25 ENCOUNTER — Ambulatory Visit: Payer: Self-pay

## 2011-04-26 NOTE — Op Note (Signed)
[facility]  Patient:    [name], [name] Visit Number: [id number] MRN: [id number]          Service Type: DSU Location: DAY Attending Physician:  Skip Mayer Dictated by:   Georges Lynch [name], M.D. Proc. Date: 08/19/01 Admit Date:  08/19/2001                             Operative Report  PREOPERATIVE DIAGNOSES: 1. Severe degenerative arthritis, both knees. 2. Herniated lumbar disc at L4-5 and L5-S1 which were in the foramen    and extra foraminal. 3. Lateral recess stenosis at L4-5 and L5-S1.  POSTOPERATIVE DIAGNOSES: 1. Severe degenerative arthritis, both knees. 2. Herniated lumbar disc at L4-5 and L5-S1 which were in the foramen    and extra foraminal. 3. Lateral recess stenosis at L4-5 and L5-S1.  SURGEON:  Georges Lynch. [name], M.D.  ASSISTANT:  Javier Docker, M.D.  DESCRIPTION OF PROCEDURE:  Prior to surgery when the patient was asleep I did sterile preps of both knees and injected both knees with 1 cc of Kenalog and 5 cc of 0.5% Marcaine.  Following this, the patient was then placed on a spinal table in the usual fashion.  Sterile prep and draping were carried out and an incision was made after two needles were placed in the back and an x-ray was taken to verify position.  Incision then was carried down to the L4-5 and L5-S1 interspaces.  Bleeders were identified and cauterized.  Note, we approached the interspaces from the right side only.  All his symptoms were on the right.  At this time, a Kocher clamp was placed on the spinous process of L4 and another x-ray was taken to verify our L4 to L5 interspaces.  We then went down and cleared the muscle from the lamina and the spinous processes. Self retaining retractors were inserted and we then utilized the bur to carry out our hemilaminectomies and to bur far out laterally at both levels at 4-5 and 5-1 on the right in order to reach the foraminal disc herniations.  At this  time, we approached the L5-S1 interspace first.  We gently retracted a nerve root, packed the area off with Thrombin soaked Gelfoam and then made a cruciate incision at the posterior longitudinal ligament and carried out our diskectomy for the herniated disc. We also went out far laterally with the hockey stick and made two other and other loose fragments noted.  All fragments were removed.  The nerve now was quite free. We also did a foraminotomy at this level as well.  Following this, we then approached the advanced microscope proximally to the L4-5 space and removed the ligamentum flavum there as well and then went out with the bur and burred out laterally and superiorly and identified a root.  We then made a cruciate incision in the posterior longitudinal ligament and completed our diskectomy. We went far out laterally as well here because of the foraminal disc.  We also utilized the hockey stick to make sure that the nerve root was free and it was.  We did foraminotomies here as well.  We also had to do partial fasciectomy.  The L4-5 space was very tight.  The nerve roots now were free.  Hemostasis was well maintained with Thrombin soaked Gelfoam.  The wound then was closed in layers in the usual fashion.  Sterile Neosporin dressing was applied.  The patient  had 1 gram of IV Ancef preoperatively. Dictated by:   Georges Lynch [name], M.D. Attending Physician:  Skip Mayer DD:  08/19/01 TD:  08/19/01 Job: [id number]

## 2011-04-26 NOTE — Op Note (Signed)
[name], [name]             ACCOUNT NO.:  [id number]   MEDICAL RECORD NO.:  [id number]          PATIENT TYPE:  INP   LOCATION:  1515                         [facility]AC[facility]   PHYSICIAN:  Georges Lynch. Gioffre, M.D.DATE OF BIRTH:  01-02-1951   DATE OF PROCEDURE:  DATE OF DISCHARGE:                                 OPERATIVE REPORT   PREOPERATIVE DIAGNOSIS:  Severe degenerative arthritis of the genu varus of  the left knee.   POSTOPERATIVE DIAGNOSIS:  Severe degenerative arthritis of the genu varus of  the left knee.   OPERATION:  Left total knee arthroplasty utilizing a DePuy rotating platform  prosthesis.  All three components were cemented.  Vancomycin was used in the  cement.   SIZES USED:  The patella was a size 41 mm in diameter.  The femoral  component was a size 5 left cruciate substituting component.  The tibial  tray was a size 5 tibial tray, cemented.  The insert was a size 5, 15 mm  thickness tibial insert.   SURGEON:  Georges Lynch. [name], M.D.   ASSISTANT:  Madlyn Frankel. Charlann Boxer, M.D.   OPERATION:  A sterile prep and drape was carried out.  The leg was  exsanguinated and esmarched.  The tourniquet was elevated at 375 mmHg.  At  this time, an incision was made over the anterior aspect of the left knee.  Bleeders were identified and cauterized.  Two flaps were created.  I then  carried out a median parapatellar incision.  The patella was reflected  laterally.  The large spurs in the patella were removed as well as the loose  body in the suprapatellar pouch.  Following this, I did a synovectomy.  I  then flexed the knee and did lateral medial meniscectomies and excised the  anterior and posterior cruciate ligaments.  Following this, an initial drill  hole was made in the intercondylar notch.  The #1 jig was inserted, and 12  mm thickness was removed from the distal femur.  Following that, a #2 jig  was inserted for a size 5 femur.  We did measure the size 5 femur.  The  appropriate cuts were then made with the third jig.  Following the  preparation of the femur, we then went down and prepared the tibia.  We  measured the tibia to be 10 mm from the lateral side, and the appropriate  tibial cut was made.  After this was done, we then sized our tray. Our tray  was a size 5 tibial tray.  Initial drill hole was made in the tibia.  We  then inserted our guide, intramedullary guide rod, and then we next used the  next jig.  It was pinned in place after the appropriate measurement was  taken with the stylette.  The size 5 tibial tray was inserted and was  applied to the tibial surface after the cut was made.  Following this, we  pinned it in place.  Our drill holes were made in the usual fashion.  Our  keel cut was then made.  After the preparation of the tibia,  we went back  and made our box cut in the femur.  We utilized our spacer block and  accepted the fact that we would need a 15 mm thickness tibial insert.  Following that, we then prepared our patella.  We measured the patella for a  41 mm diameter.  We then cut the appropriate surface off of the patella for  a resurfacing patella.  Three drill holes were made in the patella in the  usual fashion.  We then thoroughly water-picked out the knee, dried the knee  out, and cemented all three components in simultaneously.  Following that,  after the cement was hardened, we removed a trial tibial tray and then  removed all of the loose cement.  We then went back and inserted a 15 mm  thickness tibial tray.  We had excellent stability in all ranges.  We then  removed the tibial tray, water-picked the knee out thoroughly, and then  inserted our permanent tibial tray.  We cemented all three component in  simultaneously.  As mentioned, the vancomycin was used in the cement.  After  water-picking out the knee, we dried the knee, reduced the knee, and took  the knee through motion again with a permanent 15 mm thickness  tibial  insert, rotating platform type, and we had good motion with stability.  I then inserted a Hemovac drain and closed the wound in layers in the usual  fashion.  Skin was closed with metal staples.  We did use a Hemovac, as I  mentioned.  Sterile Neosporin dressings were applied.  Patient received 1 gm  of IV Ancef preop.  The patient left the operating room in satisfactory  condition.       RAG/MEDQ  D:  05/31/2005  T:  05/31/2005  Job:  [id number]

## 2011-04-26 NOTE — Op Note (Signed)
NAME:  [name], [name]             ACCOUNT NO.:  [id number]   MEDICAL RECORD NO.:  [id number]          PATIENT TYPE:  INP   LOCATION:  X001                         FACILITY:  [facility]   PHYSICIAN:  Georges Lynch. Gioffre, M.D.DATE OF BIRTH:  01-08-1951   DATE OF PROCEDURE:  11/27/2005  DATE OF DISCHARGE:                                 OPERATIVE REPORT   SURGEON:  Dr. [name]   ASSISTANT:  Arlyn Leak, PA-C.   PREOPERATIVE DIAGNOSIS:  Severe degenerative arthritis, right knee.   POSTOPERATIVE DIAGNOSIS:  Severe degenerative arthritis, right knee.  He  also had a genu varus deformity.   OPERATION:  A right total knee arthroplasty utilizing the DePuy total knee  system, the rotating platform type.  All 3 components were cemented, and  vancomycin was used in the cement.  The sizes used were as follows:  The  femoral component was a size 4 right posterior cruciate substituting  prosthesis.  The tibial tray was a size 5 tibial tray.  The tibial insert  was a rotating platform type size 4, 10 mm thickness.  The patella was a  size 41 mm with 3 pegs.   PROCEDURE:  Under general anesthesia, a routine orthopedic prep and draping  of the right lower extremity was carried out.  The patient had 1 gram of IV  Ancef preop.  At this time, sterile prep and draping of the knee was  carried.  The leg was exsanguinated with an Esmarch.  Tourniquet was  elevated at 375 mmHg.  Following this, an incision was made over the  anterior aspect of the right knee, bleeders identified and cauterized.  I  created 2 flaps, reflected those, and inserted the self-retaining retractors  and then did a median parapatellar incision and everted the patella, flexed  the knee, and did medial and lateral meniscectomies and excised the anterior  posterior cruciate ligaments.  Following that, I then removed all the spurs  from the patella, the femur, and tibia.  We then made our initial drill hole  in the intercondylar notch  and then removed 11 mm thickness off the distal  femur.  Following that, the #2 jig was inserted, and we measured this to be  a size 4 femoral component.  We then inserted our third jig and made our  anterior, posterior, and chamfering cuts in the usual fashion.  Once this  was done, we then directed attention the tibia.  We removed 4 mm thickness  off the articular surface of the tibia, measuring from the medial affected  side.  Following this, we then removed a few loose bodies in the posterior  knee.  We then measured our tray to be a size five and then following that,  we cut our keel cut out of the tibial plateau in the usual fashion.  Following that, I then made my notch cut in the distal femur in the usual  fashion.  We then inserted our trial components.  We first tried a 12 mm  thickness and a 10.  We felt the 10 mm thickness was much better because the  12.5 mm thickness prevented [place] from the complete extension of the knee.  We  then cut our patella in the usual fashion for a size 41 patella.  We did a  resurfacing type procedure on the patella.  Three drill holes were made in  the patella.  We then removed all trial components, thoroughly water picked  out the knee, and then cemented all 3 components in simultaneously using  vancomycin in the cement.  Following that, we then went through our trials  again.  We inserted a 12.5 mm thickness tibial insert.  It was too tight, so  we then went back to the 10 mm thickness permanent insert.  Note, this was  all done after we removed all loose pieces of cement in the knee.  Prior  to  inserting our trials, we utilize our spacer block and had good stability in  flexion and extension.  We thoroughly water picked out the  knee.  All 3 components were cemented in nicely.  We inserted the Hemovac  drain, irrigated the knee out again with antibiotic solution, and closed all  the layers of tissue in the usual fashion.  The skin was closed with  metal  staples, sterile Neosporin dressings applied.  The patient 1 gram of IV  Ancef preop.           ______________________________  Georges Lynch. [name], M.D.     RAG/MEDQ  D:  11/27/2005  T:  11/29/2005  Job:  [id number]

## 2011-04-26 NOTE — Discharge Summary (Signed)
NAME:  [name], [name]             ACCOUNT NO.:  [id number]   MEDICAL RECORD NO.:  [id number]          PATIENT TYPE:  INP   LOCATION:  1504                         FACILITY:  [facility]   PHYSICIAN:  Georges Lynch. Gioffre, M.D.DATE OF BIRTH:  1951-04-17   DATE OF ADMISSION:  11/27/2005  DATE OF DISCHARGE:  12/02/2005                                 DISCHARGE SUMMARY   ADMISSION DIAGNOSES:  1.  End-stage osteoarthritis, right knee.  2.  Hypertension.  3.  Reflux disease.   DISCHARGE DIAGNOSES:  1.  Right knee total arthroplasty.  2.  Postoperative cellulitis.  3.  Hypertension.  4.  Reflux disease.   HISTORY OF PRESENT ILLNESS:  Patient is a 60 year old gentleman followed by  Dr. [name] for a long period for bilateral knee pain.  He had end-stage  osteoarthritis of the bilateral knees.  He had a left total knee previous  this year with good results.  He presents with continued pain and problems  with progressively worsening ability to range of motion and ambulate on his  right knee.  Patient requests right total knee arthroplasty.   ALLERGIES:  No known drug allergies.   CURRENT MEDICATIONS:  1.  Protonix 40 mg daily.  2.  Folic acid 2 mg a day.  3.  Hydrochlorothiazide 12 mg a day.  4.  Lisinopril 40 mg a day.  5.  Aspirin 325 mg a day.   PAST SURGICAL HISTORY:  On November 27, 2005, the patient was taken to the  OR by Dr. Ranee Gosselin, assisted by Arlyn Leak, PA-C.  Under general  anesthesia, the patient underwent a right total knee arthroplasty.  Tolerated it well.  No complications.  Estimated 50 ml of blood loss.  Patient had the following components implanted:  A size 4 right femoral  component, a size 5 keel tibial tray, a size 4 10 mm polyethylene bearing, a  size 41 mm 3 peg patella.  All components were implanted with  methylmethacrylate and vancomycin.  Patient tolerated it well and was  transferred to the recovery room.   CONSULTS:  The following routine consults  were requested, physical therapy,  occupational therapy.   HOSPITAL COURSE:  On November 27, 2005, the patient was admitted to [facility] under the care of Dr. Ranee Gosselin.  Patient was taken to  the OR, where a right total knee arthroplasty was performed without any  complications.  Patient tolerated this procedure well and was transferred to  the recovery room and to the orthopedic floor in good condition.  Patient  had IV antibiotics and pain medicines on board.  He was also started on  heparin and Coumadin for DVT prophylaxis.   The patient then incurred a total of five days postoperative course on the  hospital floor, in which the patient develop some cellulitis on about postop  day #3.  He was restarted on his IV antibiotics, Ancef, for two days.  He  did improve.  He had no increased white count.  The patient was also able to  transition from IV pain medicine to p.o. meds well.  The patient worked well  on a day-to-day basis with his physical therapy.  The patient's wound  remained benign for any signs of infection.  Up until about postop day #3,  where he developed some cellulitic issues, but this did gradually improve  once he was restarted on IV antibiotics.  He had no increased white count.  The Doppler was negative for any signs of DVT.  It was felt on postop day  #5, the patient was orthopedically and medically stable and ready for  transfer home.  He was discharged in good condition with outpatient home  health.  Physical therapy arrangements made.   LABS:  CBC on December 24th was 9.5, hemoglobin 11, hematocrit 32.6,  platelets 308.  Coagulation studies on December 25th, PT 20.3 with an INR of  1.7.  Routine chemistries on December 23, sodium 135, potassium 3.5, glucose  106, BUN 11, creatinine 1.1.   Venous Doppler performed on December 24th showed no evidence of DVT,  superficial thrombus, or baker's cyst bilaterally.   DISCHARGE MEDICATIONS:  1.   Percocet 1-2 tablets every 4-6 hours p.r.n. pain.  2.  Lisinopril 40 mg daily.  3.  Folic acid 1 mg daily.  4.  Protonix 40 mg a day.  5.  Hydrochlorothiazide 12.5 mg a day.  6.  Reglan 10 mg p.o. q.6h. p.r.n.  7.  Colace 100 mg p.o. b.i.d.  8.  Heparin 5000 units subcu q.12h.  9.  Robaxin 500 mg p.o. q.6h. p.r.n.  10. Ferrous sulfate 325 mg p.o. t.i.d.   DISCHARGE INSTRUCTIONS:  1.  Diet:  No restrictions.  2.  Activity:  As tolerated.  Use of walker.  Patient may shower.  3.  Wound care:  Patient may change dressing daily.   MEDICATIONS:  Patient is to resume routine home meds with the addition of  Augmentin 875 mg 1 tablet daily x7 days, Robaxin 500 mg 1 tablet every 6  hours for muscle spasms, if needed, Percocet 1-2 tablets every 4-6 hours for  pain, Coumadin 5 mg 2 tablets a day, on Monday and Thursday, and 1-1/2  tablets on all other days.   FOLLOW UP:  1.  Patient needs a follow-up appointment with Dr. [name].  Patient is to      call for a follow-up appointment the      following week.  2.  Home health therapy with [facility] for pro times and physical      therapy.   CONDITION ON DISCHARGE:  Good.      Jamelle Rushing, P.A.    ______________________________  Georges Lynch [name], M.D.    RWK/MEDQ  D:  12/18/2005  T:  12/19/2005  Job:  [id number]

## 2011-04-26 NOTE — H&P (Signed)
[name], [name]             ACCOUNT NO.:  [id number]   MEDICAL RECORD NO.:  [id number]          PATIENT TYPE:   LOCATION:                                 FACILITY:   PHYSICIAN:  Georges Lynch. Gioffre, M.D.DATE OF BIRTH:  [birth year]   DATE OF ADMISSION:  11/27/2005  DATE OF DISCHARGE:                                HISTORY & PHYSICAL   CHIEF COMPLAINT:  Painful right knee.   HISTORY OF PRESENT ILLNESS:  The patient is a 60 year old gentleman who has  been followed by Dr. [name] for long periods of time with bilateral knee  pains.  He had end-stage osteoarthritis in bilateral knees, left was more  symptomatic than the right.  The left had a total knee arthroplasty earlier  this year with excellent results.  His right knee continues to progressively  worsen with ambulation and difficulty with range of motion.  He is in now  for a right total knee arthroplasty.   ALLERGIES:  NO KNOWN DRUG ALLERGIES.   CURRENT MEDICATIONS:  1.  Protonix 40 mg daily.  2.  Folic acid 2 mg a day.  3.  Hydrochlorothiazide 12.5 a day.  4.  Lisinopril 40 mg a day.  5.  Aspirin 325 mg p.o. q.h.s.   PAST MEDICAL HISTORY:  1.  Hypertension.  2.  Reflux disease.  3.  Osteoarthritis.   PAST SURGICAL HISTORY:  Includes:  1.  Left total knee arthroplasty in 2006.  2.  Lumbar discectomy in 2002.  3.  Arthroscopic scopes in the 80s.  4.  Open knee cartilage repair in the 70s.  5.  Wisdom tooth in 1975.  6.  The patient denies any complications from the above mentioned surgical      procedures.   SOCIAL HISTORY:  The patient is separated.  He is otherwise a healthy-  appearing and well-developed.  He denies any history of alcohol use.  He  quit smoking five years previous.  He lives in a one story house with three  steps to the main deck.   FAMILY MEDICAL HISTORY:  Mother is in her 51s with a history of an enlarged  heart, hypertension, diabetes, and colon cancer.  Grandfather deceased with  colon  cancer.   REVIEW OF SYSTEMS:  Positive for bad reflux, which is fairly well controlled  with his Protonix but otherwise he is just having complaints and problems  related to his right knee.  His left total knee is doing very well and he is  very happy.  The rest of the review of systems categories are negative and  non-contributory.   PHYSICAL EXAMINATION:  GENERAL:  The patient is a healthy appearing well-  developed 61 year old white male conscious, alert, and appropriate.  He  ambulates with a significant right-sided limp.  HEENT:  Head was normocephalic.  Pupils equal, round, and reactive.  Extra  ocular movements intact.  Sclerae is not icteric.  External ears were  without deformities.  Gross hearing is intact.  NECK:  Supple.  No palpable lymphadenopathy.  Thyroid region was non-tender.  He had good range of motion of  the cervical spine.  CHEST/LUNGS:  Sounds were clear bilaterally.  No wheezes, rales, or rhonchi.  HEART:  Regular rate and rhythm.  No murmurs, rubs, or gallops.  ABDOMEN:  Round, soft.  Bowel sounds were normoactive.  No severe region  tenderness.  EXTREMITIES:  Upper extremities are symmetric in size and shape.  He had  good range of motion of his shoulders, elbows, and wrists.  Motor strength  is 5/5.  Lower extremities right and left hip had full extension and flexion  of 130-120 degrees.  Internal and external rotation without any difficulty.  The left knee had a well-healed mid line surgical incision.  He had full  extension and flexion back to 110 degrees.  No instability.  The calf was  non-tender.  The right knee was round and boggy appearing.  Very tender  throughout.  No effusion.  No erythema.  He was 5 degrees short of full  extension.  He was able to flex it back to about 95 degrees.  He did have  some slight valgus varus laxity.  He had gross crepitance in his knee with  range of motion.  The calf was soft and non-tender.  The ankles were   symmetrical with dorsi-plantar flexion.  PERIPHERAL VASCULAR:  Carotid pulses were 2+.  No bruits.  Radial pulses  were 2+.  Posterior tibial pulses were 2+.  He had no lower extremity edema.  NEUROLOGIC:  The patient was conscious, alert, and appropriate.  Ease of  conversation.  Cranial nerves II-XII were grossly intact.  He had no gross  neurologic defects noted.  BREASTS/RECTAL/GENITOURINARY:  Deferred at this time.   IMPRESSION:  1.  End-stage osteoarthritis of the right knee.  2.  Six months status post left total knee arthroplasty.  3.  Hypertension.  4.  Reflux disease.   PLAN:  The patient will undergo all routine labs and tests prior to having a  right total knee arthroplasty by Dr. [name] at [facility] no  November 27, 2005.      Jamelle Rushing, P.A.    ______________________________  Georges Lynch [name], M.D.    RWK/MEDQ  D:  11/18/2005  T:  11/18/2005  Job:  [id number]

## 2011-04-26 NOTE — Discharge Summary (Signed)
[name], [name]             ACCOUNT NO.:  [id number]   MEDICAL RECORD NO.:  [id number]          PATIENT TYPE:  INP   LOCATION:  1515                         FACILITY:  [facility]   PHYSICIAN:  Georges Lynch. Gioffre, M.D.DATE OF BIRTH:  1951-10-14   DATE OF ADMISSION:  05/31/2005  DATE OF DISCHARGE:  06/05/2005                                 DISCHARGE SUMMARY   ADMISSION DIAGNOSES:  1.  Degenerative arthritis, left knee.  2.  Hypertension.  3.  Gastroesophageal reflux disease.   DISCHARGE DIAGNOSES:  1.  Degenerative arthritis, left knee, status post left total knee      arthroplasty.  2.  Hypertension.  3.  Gastroesophageal reflux disease.   PROCEDURES:  The patient was taken to the operating room May 31, 2005 to  undergo a left total knee arthroplasty.   SURGEON:  Worthy Rancher, M.D.   ASSISTANT:  Durene Romans, M.D.   ANESTHESIA:  Surgery was performed under general anesthesia.   CONSULTATIONS:  1.  PT/OT.  2.  Rehab.   BRIEF HISTORY AND PHYSICAL:  This patient is a 60 year old male who has had  bilateral knee pain, left greater than right, for many years.  He has known  degenerative arthritis, but his symptoms have recently gotten worse.  He has  had no relief with nonsteroidal antiinflammatory medications.  He is ready  to proceed with a total knee arthroplasty.  The patient is admitted to the  hospital for same.   LABORATORY DATA:  Admission CBC:  WBCs 6900, RBCs 4.98, hemoglobin 15.1,  hematocrit 43.9, platelet count 218,000.  Admission PT 12.3, INR 0.9, PTT  26.0.  Admission chemistry:  Sodium 137, potassium 3.5, chloride 102, CO2 27,  glucose 99, BUN 16.0, creatinine 1.3, calcium 9.5.  Total protein 7.4.  Admission urinalysis is normal.  The patient blood type is A negative.  Negative antibody screen.  Preoperative x-ray of left knee:  Severe  osteoarthritic changes, left knee.  Postoperative x-ray of left knee reveals  left total knee prosthesis in anatomic  position.  Hemoglobin and hematocrit  was followed throughout hospitalization.  He had a postoperative drop in his  hemoglobin to 9.9, but that stabilized prior to discharge.  He did not  require blood transfusion, and I am unable to locate his preoperative chest  x-ray or EKG on medical record.   HOSPITAL COURSE:  The patient was admitted to [facility].  He  underwent the above-stated procedure.  He tolerated the procedure well, and  was returned to the recovery room and then the orthopedic floor to continue  his postoperative care.  Hemovac drain was placed at the time of surgery.  The Hemovac was discontinued on postoperative day #1.  The patient was  placed on p.c. analgesics for pain control.  The patient's pain was well  controlled.  He was weaned over to oral analgesics, and the PCA was  discontinued on postoperative day #3.  The patient was a little slow to  progress, and he was felt to be a possible candidate for rehab admission.  The rehab department evaluated him, and it  was noted that he is a little too-  _________ functioning so he continued to work with the therapist, and he was  able to be discharged home on June 05, 2005.   DISCHARGE MEDICATIONS:  1.  Coumadin 5 mg daily.  2.  Percocet 10/650, 1 or 2 every 6 hours as needed for pain.  3.  Robaxin 500 mg, 1 every 6 hours as needed for spasm.   DIET:  No restrictions.   ACTIVITY:  Full weightbearing with walker.   WOUND CARE:  Daily dressing changes.  Keep dry and clean.   FOLLOW UP:  The patient is scheduled to follow up with Dr. [name] 2 weeks  from the date of surgery.   CONDITION ON DISCHARGE:  Stable.      [name]  D:  07/10/2005  T:  07/10/2005  Job:  [id number]   cc:   Georges Lynch. [name], M.D.  [facility]  [address]  [address]  [place]  [place] [address]  Fax: [phone]

## 2011-04-26 NOTE — Op Note (Signed)
NAME:  [name], [name]             ACCOUNT NO.:  [id number]   MEDICAL RECORD NO.:  [id number]          PATIENT TYPE:  INP   LOCATION:  1504                         FACILITY:  [facility]   PHYSICIAN:  Georges Lynch. Gioffre, M.D.DATE OF BIRTH:  1951-09-22   DATE OF PROCEDURE:  11/27/2005  DATE OF DISCHARGE:                                 OPERATIVE REPORT   ADDENDUM:   PREOPERATIVE DIAGNOSES:  Mild contracture of the previous left total knee.  The patient asked me if I would just manipulate that in surgery.   POSTOPERATIVE DIAGNOSES:  Mild contracture of the previous left total knee.  The patient asked me if I would just manipulate that in surgery.   OPERATION:  Closed manipulation left total knee. The procedure is under  general anesthesia. Prior to doing the right total knee I did a gentle  closed manipulation of his left total knee. We able to get him back to about  115-120 degrees.           ______________________________  Georges Lynch [name], M.D.     RAG/MEDQ  D:  11/27/2005  T:  11/29/2005  Job:  [id number]

## 2011-04-26 NOTE — H&P (Signed)
NAME:  [name], [name]             ACCOUNT NO.:  [id number]   MEDICAL RECORD NO.:  [id number]          PATIENT TYPE:  INP   LOCATION:  NA                           FACILITY:  [facility]   PHYSICIAN:  Georges Lynch. Gioffre, M.D.DATE OF BIRTH:  [birth year]   DATE OF ADMISSION:  DATE OF DISCHARGE:                                HISTORY & PHYSICAL   HISTORY:  The patient has had bilateral knee pain, left greater than right  for many years. He has had known significant degenerative arthritis but his  symptoms have recently gotten worse. He has had no relief with nonsteroidal  antiinflammatory medications and he is ready to proceed with a left total  knee arthroplasty.   ALLERGIES:  None.   PRIMARY CARE PHYSICIAN:  Georgina Quint. Plotnikov, M.D.   PAST MEDICAL HISTORY:  Significant for hypertension, gastroesophageal reflux  disease and degenerative arthritis.   CURRENT MEDICATIONS:  1.  Lisinopril 40 mg daily.  2.  Protonix 40 mg daily.  3.  Folic acid 1 mg twice daily.  4.  HCTZ 12.5 mg daily.  5.  Niaspan 1000 mg daily.   PAST SURGICAL HISTORY:  The patient had a left knee arthroscopy x2 and an  open procedure about 35 years ago. He has also had microdiskectomy on his  lumbar spine in 2002.   FAMILY HISTORY:  Mother had colon cancer.   REVIEW OF SYMPTOMS:  GENERAL:  Denies weight change, fever, chills, fatigue.  HEENT:  Denies headache, visual changes, tinnitus, hearing loss, sore  throat. CARDIOVASCULAR:  Denies chest pain, palpitations, shortness of  breath, orthopnea. PULMONARY:  Denies dyspnea, wheezing, cough, sputum  production, hemoptysis. GI:  Denies nausea, vomiting, hematemesis, or  abdominal pain. GU:  Denies dysuria, frequency, urgency, hematuria.  ENDOCRINE:  Denies polyuria, polydipsia, appetite change, heat or cold  intolerance. MUSCULOSKELETAL:  The patient has bilateral knee pain.  NEUROLOGIC:  Denies dizziness, vertigo, syncope, seizure. SKIN:  Denies  itching rashes,  masses or moles.   PHYSICAL EXAMINATION:  VITAL SIGNS:  Temperature is 98.7, pulse is 76,  respirations 18, blood pressure 120/80.  GENERAL:  A 60 year old male in no acute distress.  HEENT:  PERRL. EOM's intact. Pharynx clear.  NECK:  Supple without masses.  CHEST:  Clear to auscultation bilaterally. No wheezing, rales or rhonchi  noted.  HEART:  Regular rate and rhythm without murmur.  ABDOMEN:  Positive bowel sounds, soft, nontender.  EXTREMITIES:  Examination of his left knee reveals painful range of motion  of his left knee. He only flexes to about 90 degrees and he has a genu varus  deformity. Skin is warm and dry. X-rays of his left knee shows collapse of  the medial joint.   IMPRESSION:  Degenerative arthritis left knee.   PLAN:  The patient is to be admitted to [facility] May 31, 2005  to undergo a left total knee arthroplasty.      Haynes Hoehn  D:  05/27/2005  T:  05/27/2005  Job:  [id number]

## 2011-06-18 ENCOUNTER — Other Ambulatory Visit: Payer: Self-pay

## 2011-06-20 ENCOUNTER — Ambulatory Visit: Payer: Self-pay

## 2011-08-27 ENCOUNTER — Other Ambulatory Visit: Payer: Self-pay | Admitting: Cardiology

## 2011-09-26 ENCOUNTER — Ambulatory Visit (INDEPENDENT_AMBULATORY_CARE_PROVIDER_SITE_OTHER): Payer: 59 | Admitting: Surgery

## 2011-10-01 ENCOUNTER — Ambulatory Visit (INDEPENDENT_AMBULATORY_CARE_PROVIDER_SITE_OTHER): Payer: 59 | Admitting: General Surgery

## 2011-10-01 ENCOUNTER — Encounter (INDEPENDENT_AMBULATORY_CARE_PROVIDER_SITE_OTHER): Payer: Self-pay | Admitting: General Surgery

## 2011-10-01 VITALS — BP 134/84 | HR 60 | Temp 98.2°F | Resp 16 | Ht 72.0 in | Wt 254.6 lb

## 2011-10-01 DIAGNOSIS — K645 Perianal venous thrombosis: Secondary | ICD-10-CM

## 2011-10-01 NOTE — Progress Notes (Signed)
Subjective:     Patient ID: [name], male   DOB: May 07, 1951, 60 y.o.   MRN: [id number]  HPIMr. Pudlo is a 60 year old male laterally overweight who has a thrombosed external hemorrhoid in the right lateral side that he said was very tender and swollen approximately week ago he started having little bleeding yesterday and the area is not quite as large today she's never had any previous problems with his hemorrhoids for similar pain like this. He is on no blood thinners and had a colonoscopy in April 12   Review of Systems Current Outpatient Prescriptions  Medication Sig Dispense Refill  . aspirin 81 MG tablet Take 81 mg by mouth daily.        . Cholecalciferol (VITAMIN D3) 1000 UNITS CAPS Take by mouth. One capsule by mouth once daily       . fish oil-omega-3 fatty acids 1000 MG capsule Take 1 g by mouth. Take 4-6 capsules per day with food for cholesterol       . folic acid (FOLVITE) 1 MG tablet Take 1 mg by mouth daily. Take two tablets by mouth once daily      . hydrochlorothiazide (MICROZIDE) 12.5 MG capsule TAKE ONE TABLET BY MOUTH DAILY.  30 capsule  12  . lisinopril (PRINIVIL,ZESTRIL) 40 MG tablet TAKE ONE TABLET BY MOUTH DAILY  30 tablet  12  . omeprazole (PRILOSEC) 40 MG capsule Take 1 capsule (40 mg total) by mouth daily.  30 capsule  12   Current Facility-Administered Medications  Medication Dose Route Frequency Provider Last Rate Last Dose  . 0.9 %  sodium chloride infusion  500 mL Intravenous Continuous Louis Meckel, MD       Past Surgical History  Procedure Date  . Right total knee arthroplasty   . Endoscopic lumbar discectomy w/ laser   . Open knee cartilage repair   . Wisdom tooth extraction 1975  . Microdisctomy 2002    Lumbar spine  . Knee surgery     Bilateral , REPLACED  . Colonoscopy    Allergies  Allergen Reactions  . Niacin     REACTION: flushing       Objective:   Physical ExamBP 134/84  Pulse 60  Temp(Src) 98.2 F (36.8 C)  (Temporal)  Resp 16  Ht 6' (1.829 m)  Wt 254 lb 9.6 oz (115.486 kg)  BMI 34.53 kg/m2 Patient has a large thrombosed right external hemorrhoid that has partially drain and I recommended we go ahead and remove his humeral local anesthesia and the patient was in agreement says the pain is not being quite as bad since it started bleeding yesterday but there is still a significant amount of clot within this hemorrhoid remaining. With the patient in the right lateral done in position I could anesthetize the area after Betadine prep about 6-7 cc Xylocaine with adrenaline and then removed this order sized thrombosed hemorrhoid and the surrounding vein I did put a figure-of-eight suture of 3-0 at the apex for hemostasis the procedure was tolerated with minimal discomfort and he helped we'll not have any postoperative bleeding. He is a day tried her on his own stalk poor flow in understands not to be doing any strenuous activities for the next one to 2 days    Assessment:    excision of the large thrombosed external hemorrhoid in the right lateral quadrant with 3-0 chromic suture placed at the apex of the incision    Plan:     Followup  in approximately 2 weeks do an anoscopic exam at that time I that that he'll need any rubber bands or other treatment

## 2011-10-01 NOTE — Patient Instructions (Signed)
limited activity for lifting for the next couple of days and warm soaks in tub  twice a day started in the morning followup 2 weeks

## 2011-10-11 ENCOUNTER — Ambulatory Visit (INDEPENDENT_AMBULATORY_CARE_PROVIDER_SITE_OTHER): Payer: 59 | Admitting: Surgery

## 2011-10-16 ENCOUNTER — Ambulatory Visit (INDEPENDENT_AMBULATORY_CARE_PROVIDER_SITE_OTHER): Payer: 59 | Admitting: General Surgery

## 2011-10-16 ENCOUNTER — Encounter (INDEPENDENT_AMBULATORY_CARE_PROVIDER_SITE_OTHER): Payer: Self-pay | Admitting: General Surgery

## 2011-10-16 VITALS — BP 118/72 | HR 68 | Temp 97.4°F | Resp 16 | Ht 72.0 in | Wt 256.0 lb

## 2011-10-16 DIAGNOSIS — K645 Perianal venous thrombosis: Secondary | ICD-10-CM

## 2011-10-16 NOTE — Progress Notes (Signed)
Subjective:     Patient ID: [name], male   DOB: 1950-12-22, 60 y.o.   MRN: [id number]  HPIPatient was seen approximately 2-1/2 weeks ago when he had thrombosed external hemorrhoid he can try and install a heavy hole used on supplement and high in the straining most likely caused thrombosed examination at that time he had a large thrombosed hemorrhoid.   I removed the large external hemorrhoids and clot here in the office couple days later he said he felt fine the next day and he said no further problems since then. On examination today with videoscope was no significant internal hemorrhoids and there is no evidence of any problems at all. He'll see [place] on a p.r.n. basis and has had a previous colonoscopy and should not have any follow problems   Review of Systems     Objective:   Physical Examsee above     Assessment:     Asymptomatic now 2 weeks following excisionof a large thrombosed external hemorrhoid    Plan:     See [place] prn

## 2012-03-03 ENCOUNTER — Other Ambulatory Visit: Payer: Self-pay | Admitting: Gastroenterology

## 2012-08-22 ENCOUNTER — Other Ambulatory Visit: Payer: Self-pay | Admitting: Cardiology

## 2013-02-27 ENCOUNTER — Other Ambulatory Visit: Payer: Self-pay | Admitting: Gastroenterology

## 2013-09-29 ENCOUNTER — Other Ambulatory Visit: Payer: Self-pay | Admitting: Cardiology

## 2013-09-29 NOTE — Telephone Encounter (Signed)
Patient needs appointment for refills, no visit since 2011

## 2013-11-01 ENCOUNTER — Encounter: Payer: Self-pay | Admitting: Cardiology

## 2013-11-01 ENCOUNTER — Ambulatory Visit (INDEPENDENT_AMBULATORY_CARE_PROVIDER_SITE_OTHER): Payer: 59 | Admitting: Cardiology

## 2013-11-01 VITALS — BP 124/80 | HR 60 | Ht 72.0 in | Wt 269.0 lb

## 2013-11-01 DIAGNOSIS — E669 Obesity, unspecified: Secondary | ICD-10-CM | POA: Insufficient documentation

## 2013-11-01 DIAGNOSIS — E785 Hyperlipidemia, unspecified: Secondary | ICD-10-CM

## 2013-11-01 DIAGNOSIS — I10 Essential (primary) hypertension: Secondary | ICD-10-CM

## 2013-11-01 MED ORDER — HYDROCHLOROTHIAZIDE 12.5 MG PO CAPS: 12.5000 mg | ORAL_CAPSULE | Freq: Every day | ORAL | Status: DC

## 2013-11-01 MED ORDER — LISINOPRIL 40 MG PO TABS: 40.0000 mg | ORAL_TABLET | Freq: Every day | ORAL | Status: DC

## 2013-11-01 NOTE — Assessment & Plan Note (Signed)
-   Discussed importance of weight loss and exercise. 

## 2013-11-01 NOTE — Assessment & Plan Note (Signed)
Blood pressure controlled. Continue present medications. Check potassium and renal function. 

## 2013-11-01 NOTE — Patient Instructions (Signed)
Your physician wants you to follow-up in: ONE YEAR WITH DR Jens Som. You will receive a reminder letter in the mail two months in advance. If you don't receive a letter, please call our office to schedule the follow-up appointment.  Your physician recommends that you return for FASTING  lab work

## 2013-11-01 NOTE — Assessment & Plan Note (Signed)
Check lipids 

## 2013-11-01 NOTE — Progress Notes (Signed)
      HPI: followup hypertension and hyperlipidemia. Myoview in June of 2005 showed no ischemia and an ejection fraction of 69%. I have not seen him since 2010. He denies dyspnea, chest pain, palpitations or syncope.  Current Outpatient Prescriptions  Medication Sig Dispense Refill  . aspirin 81 MG tablet Take 81 mg by mouth daily.        . Cholecalciferol (VITAMIN D3) 1000 UNITS CAPS Take by mouth. One capsule by mouth once daily       . fish oil-omega-3 fatty acids 1000 MG capsule Take 1 g by mouth. Take 4-6 capsules per day with food for cholesterol       . folic acid (FOLVITE) 1 MG tablet Take 1 mg by mouth daily. Take two tablets by mouth once daily      . hydrochlorothiazide (MICROZIDE) 12.5 MG capsule TAKE ONE TABLET BY MOUTH DAILY.  30 capsule  0  . lisinopril (PRINIVIL,ZESTRIL) 40 MG tablet TAKE ONE TABLET BY MOUTH DAILY  30 tablet  0  . omeprazole (PRILOSEC) 40 MG capsule TAKE 1 CAPSULE (40 MG TOTAL) BY MOUTH DAILY.  30 capsule  12   Current Facility-Administered Medications  Medication Dose Route Frequency Provider Last Rate Last Dose  . 0.9 %  sodium chloride infusion  500 mL Intravenous Continuous Louis Meckel, MD         Past Medical History  Diagnosis Date  . Osteoarthrosis, unspecified whether generalized or localized, unspecified site   . Unspecified essential hypertension   . Esophagitis, unspecified   . Benign neoplasm of colon   . Esophageal reflux   . Other and unspecified hyperlipidemia   . Cancer     malignant skin cancer  . Hemorrhoids     Past Surgical History  Procedure Laterality Date  . Right total knee arthroplasty    . Endoscopic lumbar discectomy w/ laser    . Open knee cartilage repair    . Wisdom tooth extraction  1975  . Microdisctomy  2002    Lumbar spine  . Knee surgery      Bilateral , REPLACED  . Colonoscopy      History   Social History  . Marital Status: Legally Separated    Spouse Name: N/A    Number of Children: N/A  .  Years of Education: N/A   Occupational History  . Employed     Self Employed--Financial    Social History Main Topics  . Smoking status: Former Games developer  . Smokeless tobacco: Never Used  . Alcohol Use: No  . Drug Use: No  . Sexual Activity: Not on file   Other Topics Concern  . Not on file   Social History Narrative  . No narrative on file    ROS: no fevers or chills, productive cough, hemoptysis, dysphasia, odynophagia, melena, hematochezia, dysuria, hematuria, rash, seizure activity, orthopnea, PND, pedal edema, claudication. Remaining systems are negative.  Physical Exam: Well-developed obese in no acute distress.  Skin is warm and dry.  HEENT is normal.  Neck is supple.  Chest is clear to auscultation with normal expansion.  Cardiovascular exam is regular rate and rhythm.  Abdominal exam nontender or distended. No masses palpated. Extremities show no edema. neuro grossly intact  ECG sinus rhythm at a rate of 60. No ST changes.

## 2014-03-11 ENCOUNTER — Other Ambulatory Visit: Payer: Self-pay | Admitting: Gastroenterology

## 2014-04-25 ENCOUNTER — Telehealth: Payer: Self-pay | Admitting: Gastroenterology

## 2014-04-25 MED ORDER — OMEPRAZOLE 40 MG PO CPDR: 40.0000 mg | DELAYED_RELEASE_CAPSULE | Freq: Every day | ORAL | Status: DC

## 2014-04-25 NOTE — Telephone Encounter (Signed)
Refilled Omeprazole as patient has appointment scheduled next month

## 2014-05-23 ENCOUNTER — Ambulatory Visit (INDEPENDENT_AMBULATORY_CARE_PROVIDER_SITE_OTHER): Payer: 59 | Admitting: Gastroenterology

## 2014-05-23 ENCOUNTER — Encounter: Payer: Self-pay | Admitting: Gastroenterology

## 2014-05-23 VITALS — BP 100/60 | HR 60 | Ht 69.0 in | Wt 265.0 lb

## 2014-05-23 DIAGNOSIS — K219 Gastro-esophageal reflux disease without esophagitis: Secondary | ICD-10-CM

## 2014-05-23 DIAGNOSIS — Z800 Family history of malignant neoplasm of digestive organs: Secondary | ICD-10-CM

## 2014-05-23 DIAGNOSIS — D126 Benign neoplasm of colon, unspecified: Secondary | ICD-10-CM

## 2014-05-23 MED ORDER — OMEPRAZOLE 40 MG PO CPDR: 40.0000 mg | DELAYED_RELEASE_CAPSULE | Freq: Every day | ORAL | Status: DC

## 2014-05-23 NOTE — Patient Instructions (Signed)
We will renew your prescription Omeprazole

## 2014-05-23 NOTE — Progress Notes (Signed)
_                                                                                                                History of Present Illness: Pleasant 63 year old white male with history of GERD, family history of colorectal cancer, here for followup and for medication refill.  On omeprazole 40 mg daily his pyrosis is fair well-controlled.  Attempt at weaning him from PPI therapy in the past have been unsuccessful.  Last colonoscopy in 2012 was normal.  Family history is pertinent for his mother who had colon cancer.    Past Medical History  Diagnosis Date  . Osteoarthrosis, unspecified whether generalized or localized, unspecified site   . Unspecified essential hypertension   . Esophagitis, unspecified   . Benign neoplasm of colon   . Esophageal reflux   . Other and unspecified hyperlipidemia   . Skin cancer of nose     malignant skin cancer  . Hemorrhoids    Past Surgical History  Procedure Laterality Date  . Right total knee arthroplasty    . Endoscopic lumbar discectomy w/ laser    . Open knee cartilage repair    . Wisdom tooth extraction  1975  . Microdisctomy  2002    Lumbar spine  . Knee surgery      Bilateral , REPLACED  . Colonoscopy     family history includes Cancer in his mother and paternal grandfather; Colon cancer in his mother and paternal grandfather; Diabetes in his mother; Hypertension in his mother. Current Outpatient Prescriptions  Medication Sig Dispense Refill  . aspirin 81 MG tablet Take 81 mg by mouth daily.        . Cholecalciferol (VITAMIN D3) 1000 UNITS CAPS Take by mouth. One capsule by mouth once daily       . fish oil-omega-3 fatty acids 1000 MG capsule Take 1 g by mouth. Take 4-6 capsules per day with food for cholesterol       . folic acid (FOLVITE) 1 MG tablet Take 1 mg by mouth daily. Take two tablets by mouth once daily      . hydrochlorothiazide (MICROZIDE) 12.5 MG capsule Take 1 capsule (12.5 mg total) by mouth daily.  30  capsule  11  . lisinopril (PRINIVIL,ZESTRIL) 40 MG tablet Take 1 tablet (40 mg total) by mouth daily.  30 tablet  11  . omeprazole (PRILOSEC) 40 MG capsule Take 1 capsule (40 mg total) by mouth daily.  30 capsule  2   Current Facility-Administered Medications  Medication Dose Route Frequency Provider Last Rate Last Dose  . 0.9 %  sodium chloride infusion  500 mL Intravenous Continuous Inda Castle, MD       Allergies as of 05/23/2014 - Review Complete 05/23/2014  Allergen Reaction Noted  . Niacin  04/10/2009    reports that he has quit smoking. He has never used smokeless tobacco. He reports that he does not drink alcohol or use illicit drugs.     Review of Systems: Pertinent  positive and negative review of systems were noted in the above HPI section. All other review of systems were otherwise negative.  Vital signs were reviewed in today's medical record Physical Exam: General: Well developed , well nourished, no acute distress Skin: anicteric Head: Normocephalic and atraumatic Eyes:  sclerae anicteric, EOMI Ears: Normal auditory acuity Mouth: No deformity or lesions Neck: Supple, no masses or thyromegaly Lungs: Clear throughout to auscultation Heart: Regular rate and rhythm; no murmurs, rubs or bruits Abdomen: Soft, non tender and non distended. No masses, hepatosplenomegaly or hernias noted. Normal Bowel sounds Rectal:deferred Musculoskeletal: Symmetrical with no gross deformities  Skin: No lesions on visible extremities Pulses:  Normal pulses noted Extremities: No clubbing, cyanosis, edema or deformities noted Neurological: Alert oriented x 4, grossly nonfocal Cervical Nodes:  No significant cervical adenopathy Inguinal Nodes: No significant inguinal adenopathy Psychological:  Alert and cooperative. Normal mood and affect  See Assessment and Plan under Problem List

## 2014-05-23 NOTE — Assessment & Plan Note (Signed)
Patient has PPI-dependent GERD which is well-controlled with omeprazole.  Plan to continue with the same.

## 2014-05-23 NOTE — Assessment & Plan Note (Signed)
Plan to continue colonoscopy at 5 year interval is.  Next exam due 2017

## 2014-07-19 ENCOUNTER — Other Ambulatory Visit: Payer: Self-pay | Admitting: Gastroenterology

## 2015-04-25 NOTE — Progress Notes (Signed)
      HPI: followup hypertension and hyperlipidemia. Myoview in June of 2005 showed no ischemia and an ejection fraction of 69%. Since last seen, he notes increased dyspnea on exertion for several months. No orthopnea, PND, pedal edema, chest pain, palpitations or syncope.  No current outpatient prescriptions on file.   Current Facility-Administered Medications  Medication Dose Route Frequency Provider Last Rate Last Dose  . 0.9 %  sodium chloride infusion  500 mL Intravenous Continuous Inda Castle, MD         Past Medical History  Diagnosis Date  . Osteoarthrosis, unspecified whether generalized or localized, unspecified site   . Unspecified essential hypertension   . Esophagitis, unspecified   . Benign neoplasm of colon   . Esophageal reflux   . Other and unspecified hyperlipidemia   . Skin cancer of nose     malignant skin cancer  . Hemorrhoids     Past Surgical History  Procedure Laterality Date  . Right total knee arthroplasty    . Endoscopic lumbar discectomy w/ laser    . Open knee cartilage repair    . Wisdom tooth extraction  1975  . Microdisctomy  2002    Lumbar spine  . Knee surgery      Bilateral , REPLACED  . Colonoscopy      History   Social History  . Marital Status: Legally Separated    Spouse Name: N/A  . Number of Children: N/A  . Years of Education: N/A   Occupational History  . Employed     Self Employed--Financial    Social History Main Topics  . Smoking status: Former Research scientist (life sciences)  . Smokeless tobacco: Never Used  . Alcohol Use: No  . Drug Use: No  . Sexual Activity: Not on file   Other Topics Concern  . Not on file   Social History Narrative    ROS: no fevers or chills, productive cough, hemoptysis, dysphasia, odynophagia, melena, hematochezia, dysuria, hematuria, rash, seizure activity, orthopnea, PND, pedal edema, claudication. Remaining systems are negative.  Physical Exam: Well-developed obese in no acute distress.  Skin is  warm and dry.  HEENT is normal.  Neck is supple.  Chest is clear to auscultation with normal expansion.  Cardiovascular exam is regular rate and rhythm.  Abdominal exam nontender or distended. No masses palpated. Positive bruit Extremities show no edema. neuro grossly intact  ECG sinus rhythm, inferior lateral T-wave changes.

## 2015-04-26 ENCOUNTER — Ambulatory Visit (INDEPENDENT_AMBULATORY_CARE_PROVIDER_SITE_OTHER): Payer: BLUE CROSS/BLUE SHIELD | Admitting: Cardiology

## 2015-04-26 ENCOUNTER — Encounter: Payer: Self-pay | Admitting: *Deleted

## 2015-04-26 ENCOUNTER — Encounter: Payer: Self-pay | Admitting: Cardiology

## 2015-04-26 VITALS — BP 167/93 | HR 60 | Ht 69.0 in | Wt 273.0 lb

## 2015-04-26 DIAGNOSIS — E785 Hyperlipidemia, unspecified: Secondary | ICD-10-CM | POA: Diagnosis not present

## 2015-04-26 DIAGNOSIS — R0989 Other specified symptoms and signs involving the circulatory and respiratory systems: Secondary | ICD-10-CM | POA: Diagnosis not present

## 2015-04-26 DIAGNOSIS — E669 Obesity, unspecified: Secondary | ICD-10-CM | POA: Diagnosis not present

## 2015-04-26 DIAGNOSIS — R0600 Dyspnea, unspecified: Secondary | ICD-10-CM

## 2015-04-26 MED ORDER — LISINOPRIL 40 MG PO TABS: 40.0000 mg | ORAL_TABLET | Freq: Every day | ORAL | Status: DC

## 2015-04-26 MED ORDER — HYDROCHLOROTHIAZIDE 12.5 MG PO CAPS: 12.5000 mg | ORAL_CAPSULE | Freq: Every day | ORAL | Status: DC

## 2015-04-26 NOTE — Assessment & Plan Note (Signed)
Check lipids 

## 2015-04-26 NOTE — Addendum Note (Signed)
Addended by: Cristopher Estimable on: 04/26/2015 12:21 PM   Modules accepted: Orders

## 2015-04-26 NOTE — Assessment & Plan Note (Signed)
Etiology unclear. Plan nuclear study for risk stratification given family history of coronary disease. Check echocardiogram for LV systolic and diastolic function. If above negative I will refer for sleep study as he could have sleep apnea.

## 2015-04-26 NOTE — Assessment & Plan Note (Signed)
Discussed need for weight loss. 

## 2015-04-26 NOTE — Assessment & Plan Note (Signed)
Blood pressure is elevated. He has not been taking his lisinopril hydrochlorothiazide. Plan to resume at previous dose. Check potassium and renal function in 1 week.

## 2015-04-26 NOTE — Assessment & Plan Note (Signed)
Schedule ultrasound to exclude aneurysm. 

## 2015-04-26 NOTE — Patient Instructions (Signed)
Your physician recommends that you schedule a follow-up appointment in: [place] has requested that you have en exercise stress myoview. For further information please visit [email or web address]. Please follow instruction sheet, as given.   Your physician has requested that you have an echocardiogram. Echocardiography is a painless test that uses sound waves to create images of your heart. It provides your doctor with information about the size and shape of your heart and how well your heart's [place] and valves are working. This procedure takes approximately one hour. There are no restrictions for this procedure.   Your physician has requested that you have an abdominal aorta duplex. During this test, an ultrasound is used to evaluate the aorta. Allow 30 minutes for this exam. Do not eat after midnight the day before and avoid carbonated beverages   RESTART LISINOPRIL/HCTZ  Your physician recommends that you return for lab work in: [place]

## 2015-05-04 ENCOUNTER — Other Ambulatory Visit: Payer: Self-pay | Admitting: Cardiology

## 2015-05-04 ENCOUNTER — Other Ambulatory Visit: Payer: Self-pay | Admitting: *Deleted

## 2015-05-04 DIAGNOSIS — E785 Hyperlipidemia, unspecified: Secondary | ICD-10-CM

## 2015-05-04 DIAGNOSIS — Z79899 Other long term (current) drug therapy: Secondary | ICD-10-CM

## 2015-05-04 LAB — HEPATIC FUNCTION PANEL
ALBUMIN: 4.1 g/dL (ref 3.5–5.2)
ALT: 35 U/L (ref 0–53)
AST: 25 U/L (ref 0–37)
Alkaline Phosphatase: 93 U/L (ref 39–117)
Bilirubin, Direct: 0.1 mg/dL (ref 0.0–0.3)
Indirect Bilirubin: 0.4 mg/dL (ref 0.2–1.2)
Total Bilirubin: 0.5 mg/dL (ref 0.2–1.2)
Total Protein: 7.3 g/dL (ref 6.0–8.3)

## 2015-05-04 LAB — LIPID PANEL
CHOLESTEROL: 173 mg/dL (ref 0–200)
HDL: 25 mg/dL — ABNORMAL LOW (ref >=40)
LDL CALC: 101 mg/dL — AB (ref 0–99)
Total CHOL/HDL Ratio: 6.9 Ratio
Triglycerides: 234 mg/dL — ABNORMAL HIGH (ref <150)
VLDL: 47 mg/dL — AB (ref 0–40)

## 2015-05-05 ENCOUNTER — Telehealth (HOSPITAL_COMMUNITY): Payer: Self-pay

## 2015-05-05 NOTE — Telephone Encounter (Signed)
Encounter complete. 

## 2015-05-09 ENCOUNTER — Telehealth (HOSPITAL_COMMUNITY): Payer: Self-pay

## 2015-05-09 NOTE — Telephone Encounter (Signed)
Encounter complete. 

## 2015-05-10 ENCOUNTER — Ambulatory Visit (HOSPITAL_COMMUNITY)
Admission: RE | Admit: 2015-05-10 | Discharge: 2015-05-10 | Disposition: A | Discharge status: Home / Self Care | Payer: BLUE CROSS/BLUE SHIELD | Source: Ambulatory Visit | Attending: Cardiology | Admitting: Cardiology

## 2015-05-10 DIAGNOSIS — R0600 Dyspnea, unspecified: Secondary | ICD-10-CM | POA: Insufficient documentation

## 2015-05-10 LAB — MYOCARDIAL PERFUSION IMAGING
CHL CUP MPHR: 156 {beats}/min
CHL CUP RESTING HR STRESS: 57 {beats}/min
CHL CUP STRESS STAGE 1 SBP: 125 mmHg
CHL CUP STRESS STAGE 1 SPEED: 0 mph
CHL CUP STRESS STAGE 2 HR: 61 {beats}/min
CHL CUP STRESS STAGE 2 SPEED: 0.9 mph
CHL CUP STRESS STAGE 3 GRADE: 0 %
CHL CUP STRESS STAGE 3 HR: 61 {beats}/min
CHL CUP STRESS STAGE 4 SBP: 107 mmHg
CHL CUP STRESS STAGE 4 SPEED: 1.7 mph
CHL CUP STRESS STAGE 5 GRADE: 12 %
CHL CUP STRESS STAGE 5 SBP: 149 mmHg
CHL CUP STRESS STAGE 5 SPEED: 2.5 mph
CHL CUP STRESS STAGE 6 HR: 134 {beats}/min
CHL CUP STRESS STAGE 9 GRADE: 0 %
CHL CUP STRESS STAGE 9 HR: 85 {beats}/min
CHL CUP STRESS STAGE 9 SBP: 173 mmHg
CSEPED: 7 min
CSEPHR: 87 %
CSEPPMHR: 85 %
Estimated workload: 6.7 METS
Exercise duration (sec): 5 s
LVDIAVOL: 93 mL
LVSYSVOL: 47 mL
NUC STRESS EF: 50 %
Peak HR: 134 {beats}/min
RPE: 26792
SDS: 2
SRS: 3
SSS: 5
Stage 1 DBP: 78 mmHg
Stage 1 Grade: 0 %
Stage 1 HR: 62 {beats}/min
Stage 2 Grade: 0 %
Stage 3 Speed: 1 mph
Stage 4 DBP: 71 mmHg
Stage 4 Grade: 10 %
Stage 4 HR: 110 {beats}/min
Stage 5 DBP: 80 mmHg
Stage 5 HR: 134 {beats}/min
Stage 6 Grade: 12.2 %
Stage 6 Speed: 2.5 mph
Stage 7 Grade: 14 %
Stage 7 HR: 134 {beats}/min
Stage 7 Speed: 2 mph
Stage 8 DBP: 85 mmHg
Stage 8 Grade: 0 %
Stage 8 HR: 122 {beats}/min
Stage 8 SBP: 174 mmHg
Stage 8 Speed: 0 mph
Stage 9 DBP: 87 mmHg
Stage 9 Speed: 0 mph
TID: 1.09

## 2015-05-10 MED ORDER — TECHNETIUM TC 99M SESTAMIBI GENERIC - CARDIOLITE
10.4000 | Freq: Once | INTRAVENOUS | Status: AC | PRN
  Administered 2015-05-10: 10 via INTRAVENOUS

## 2015-05-10 MED ORDER — TECHNETIUM TC 99M SESTAMIBI GENERIC - CARDIOLITE
32.0000 | Freq: Once | INTRAVENOUS | Status: AC | PRN
  Administered 2015-05-10: 32 via INTRAVENOUS

## 2015-05-11 ENCOUNTER — Other Ambulatory Visit (HOSPITAL_COMMUNITY): Payer: Self-pay | Admitting: Cardiology

## 2015-05-11 ENCOUNTER — Ambulatory Visit (HOSPITAL_COMMUNITY)
Admission: RE | Admit: 2015-05-11 | Discharge: 2015-05-11 | Disposition: A | Discharge status: Home / Self Care | Payer: BLUE CROSS/BLUE SHIELD | Source: Ambulatory Visit | Attending: Cardiology | Admitting: Cardiology

## 2015-05-11 DIAGNOSIS — R0600 Dyspnea, unspecified: Secondary | ICD-10-CM | POA: Diagnosis not present

## 2015-05-11 DIAGNOSIS — R0989 Other specified symptoms and signs involving the circulatory and respiratory systems: Secondary | ICD-10-CM

## 2015-05-11 DIAGNOSIS — I10 Essential (primary) hypertension: Secondary | ICD-10-CM | POA: Diagnosis not present

## 2015-05-11 DIAGNOSIS — D126 Benign neoplasm of colon, unspecified: Secondary | ICD-10-CM

## 2015-05-11 DIAGNOSIS — E669 Obesity, unspecified: Secondary | ICD-10-CM

## 2015-05-11 DIAGNOSIS — Z800 Family history of malignant neoplasm of digestive organs: Secondary | ICD-10-CM

## 2015-05-11 DIAGNOSIS — I495 Sick sinus syndrome: Secondary | ICD-10-CM

## 2015-05-11 MED ORDER — PERFLUTREN LIPID MICROSPHERE
1.0000 mL | INTRAVENOUS | Status: AC | PRN
  Administered 2015-05-11: 2.5 mL via INTRAVENOUS

## 2015-05-11 MED ORDER — PERFLUTREN LIPID MICROSPHERE: 1.0000 mL | INTRAVENOUS | Status: AC | PRN

## 2015-05-12 ENCOUNTER — Ambulatory Visit (HOSPITAL_COMMUNITY)
Admission: RE | Admit: 2015-05-12 | Discharge: 2015-05-12 | Disposition: A | Discharge status: Home / Self Care | Payer: BLUE CROSS/BLUE SHIELD | Source: Ambulatory Visit | Attending: Cardiovascular Disease | Admitting: Cardiovascular Disease

## 2015-05-12 DIAGNOSIS — R0989 Other specified symptoms and signs involving the circulatory and respiratory systems: Secondary | ICD-10-CM

## 2015-06-20 NOTE — Progress Notes (Signed)
      HPI: Fu hypertension and hyperlipidemia. Echo 6/16 showed normal LV function, mild LVH/LVE and grade 1 diastolic dysfunction. Myoview in June of 2016 showed no ischemia and an ejection fraction of 50%. Abd ultrasound 6/16 showed no aneurysm. Since last seen, he has some dyspnea on exertion but no orthopnea, PND, pedal edema, chest pain or syncope.  Current Outpatient Prescriptions  Medication Sig Dispense Refill  . aspirin 81 MG tablet Take 81 mg by mouth daily.    . hydrochlorothiazide (MICROZIDE) 12.5 MG capsule Take 1 capsule (12.5 mg total) by mouth daily. 90 capsule 3  . lisinopril (PRINIVIL,ZESTRIL) 40 MG tablet Take 1 tablet (40 mg total) by mouth daily. 90 tablet 3  . omeprazole (PRILOSEC) 40 MG capsule Take 40 mg by mouth daily.     Current Facility-Administered Medications  Medication Dose Route Frequency Provider Last Rate Last Dose  . 0.9 %  sodium chloride infusion  500 mL Intravenous Continuous Inda Castle, MD         Past Medical History  Diagnosis Date  . Osteoarthrosis, unspecified whether generalized or localized, unspecified site   . Unspecified essential hypertension   . Esophagitis, unspecified   . Benign neoplasm of colon   . Esophageal reflux   . Other and unspecified hyperlipidemia   . Skin cancer of nose     malignant skin cancer  . Hemorrhoids   . Hypertension     Past Surgical History  Procedure Laterality Date  . Right total knee arthroplasty    . Endoscopic lumbar discectomy w/ laser    . Open knee cartilage repair    . Wisdom tooth extraction  1975  . Microdisctomy  2002    Lumbar spine  . Knee surgery      Bilateral , REPLACED  . Colonoscopy      History   Social History  . Marital Status: Legally Separated    Spouse Name: N/A  . Number of Children: N/A  . Years of Education: N/A   Occupational History  . Employed     Self Employed--Financial    Social History Main Topics  . Smoking status: Former Research scientist (life sciences)  . Smokeless  tobacco: Never Used  . Alcohol Use: No  . Drug Use: No  . Sexual Activity: Not on file   Other Topics Concern  . Not on file   Social History Narrative    ROS: no fevers or chills, productive cough, hemoptysis, dysphasia, odynophagia, melena, hematochezia, dysuria, hematuria, rash, seizure activity, orthopnea, PND, pedal edema, claudication. Remaining systems are negative.  Physical Exam: Well-developed obese in no acute distress.  Skin is warm and dry.  HEENT is normal.  Neck is supple.  Chest is clear to auscultation with normal expansion.  Cardiovascular exam is regular rate and rhythm.  Abdominal exam nontender or distended. No masses palpated. Extremities show no edema. neuro grossly intact

## 2015-06-21 ENCOUNTER — Ambulatory Visit (INDEPENDENT_AMBULATORY_CARE_PROVIDER_SITE_OTHER): Payer: BLUE CROSS/BLUE SHIELD | Admitting: Cardiology

## 2015-06-21 ENCOUNTER — Encounter: Payer: Self-pay | Admitting: Cardiology

## 2015-06-21 DIAGNOSIS — I10 Essential (primary) hypertension: Secondary | ICD-10-CM | POA: Diagnosis not present

## 2015-06-21 NOTE — Assessment & Plan Note (Signed)
Discussed weight loss. 

## 2015-06-21 NOTE — Patient Instructions (Signed)
Medication Instructions:  - no change  Labwork: bmet in one week  Testing/Procedures: - none  Follow-Up: Your physician wants you to follow-up in: 6 month with Dr. Stanford Breed.  You will receive a reminder letter in the mail two months in advance. If you don't receive a letter, please call our office to schedule the follow-up appointment.   Any Other Special Instructions Will Be Listed Below (If Applicable).

## 2015-06-21 NOTE — Assessment & Plan Note (Signed)
Etiology unclear. LV function normal. Nuclear study shows no ischemia. Question obesity hypoventilation syndrome and deconditioning. I have asked him to increase his activities and discussed weight loss. Hopefully his dyspnea will improve with these measures.

## 2015-06-21 NOTE — Assessment & Plan Note (Signed)
Management per primary care. 

## 2015-06-21 NOTE — Assessment & Plan Note (Signed)
Blood pressure is mildly elevated and he has not been taking his blood pressure medications. I stressed the importance of compliance. We'll resume lisinopril/HCT. Check potassium and renal function in 1 week. Adjust regimen based on follow-up readings.

## 2015-06-30 LAB — BASIC METABOLIC PANEL
BUN: 18 mg/dL (ref 6–23)
CALCIUM: 9.6 mg/dL (ref 8.4–10.5)
CHLORIDE: 101 meq/L (ref 96–112)
CO2: 29 mEq/L (ref 19–32)
CREATININE: 1.02 mg/dL (ref 0.50–1.35)
Glucose, Bld: 94 mg/dL (ref 70–99)
Potassium: 4.5 mEq/L (ref 3.5–5.3)
Sodium: 144 mEq/L (ref 135–145)

## 2015-07-06 ENCOUNTER — Other Ambulatory Visit: Payer: Self-pay | Admitting: Gastroenterology

## 2015-09-02 ENCOUNTER — Other Ambulatory Visit: Payer: Self-pay | Admitting: Gastroenterology

## 2015-11-01 ENCOUNTER — Other Ambulatory Visit: Payer: Self-pay | Admitting: Gastroenterology

## 2015-11-07 ENCOUNTER — Telehealth: Payer: Self-pay | Admitting: Gastroenterology

## 2015-11-07 ENCOUNTER — Encounter: Payer: Self-pay | Admitting: Gastroenterology

## 2015-11-07 MED ORDER — OMEPRAZOLE 40 MG PO CPDR: 40.0000 mg | DELAYED_RELEASE_CAPSULE | Freq: Every day | ORAL | Status: DC

## 2015-11-07 NOTE — Telephone Encounter (Signed)
Refilled Omeprazole pt  will keep appointment with Dr Havery Moros for further refills. Called patient and informed

## 2015-12-26 ENCOUNTER — Encounter: Payer: Self-pay | Admitting: Gastroenterology

## 2016-01-02 ENCOUNTER — Ambulatory Visit: Payer: BLUE CROSS/BLUE SHIELD | Admitting: Gastroenterology

## 2016-02-14 ENCOUNTER — Ambulatory Visit (INDEPENDENT_AMBULATORY_CARE_PROVIDER_SITE_OTHER): Payer: Medicare Other | Admitting: Gastroenterology

## 2016-02-14 ENCOUNTER — Ambulatory Visit: Payer: BLUE CROSS/BLUE SHIELD | Admitting: Gastroenterology

## 2016-02-14 ENCOUNTER — Encounter: Payer: Self-pay | Admitting: Gastroenterology

## 2016-02-14 VITALS — BP 168/80 | HR 76 | Ht 69.0 in | Wt 273.4 lb

## 2016-02-14 DIAGNOSIS — K219 Gastro-esophageal reflux disease without esophagitis: Secondary | ICD-10-CM

## 2016-02-14 DIAGNOSIS — Z1211 Encounter for screening for malignant neoplasm of colon: Secondary | ICD-10-CM

## 2016-02-14 MED ORDER — OMEPRAZOLE 20 MG PO CPDR: 20.0000 mg | DELAYED_RELEASE_CAPSULE | Freq: Every day | ORAL | Status: DC

## 2016-02-14 NOTE — Progress Notes (Signed)
HPI :  65 y/o male known to Dr. Deatra Ina, new to me, here for follow up for CRC screening. Last exam in 2012 was a normal colonoscopy.    He denies any problems with bowel habits. No blood in the stools. No abdominal pains. He has a FH of colon cancer. Mother had colon cancer in her late 30s No other first degree family members with colon cancer. He takes ibuprofen PRN for pains, not routinely. He takes prilosec daily, for GERD. This controls his symptoms well. He denies any breakthrough on his present dose. Otherwise no weight loss. Eating well. No complaints today otherwise.   Colonoscopy 2012 - normal EGD 2006 - normal  Past Medical History  Diagnosis Date  . Osteoarthrosis, unspecified whether generalized or localized, unspecified site   . Unspecified essential hypertension   . Esophagitis, unspecified   . Benign neoplasm of colon   . Esophageal reflux   . Other and unspecified hyperlipidemia   . Skin cancer of nose     malignant skin cancer  . Hemorrhoids   . Hypertension      Past Surgical History  Procedure Laterality Date  . Right total knee arthroplasty    . Endoscopic lumbar discectomy w/ laser    . Open knee cartilage repair    . Wisdom tooth extraction  1975  . Microdisctomy  2002    Lumbar spine  . Knee surgery      Bilateral , REPLACED  . Colonoscopy    . Skin cancer removal     Family History  Problem Relation Age of Onset  . Hypertension Mother   . Colon cancer Mother   . Diabetes Mother   . Colon cancer Mother   . Colon cancer Paternal Grandfather   . Colon cancer Paternal Grandfather    Social History  Substance Use Topics  . Smoking status: Former Research scientist (life sciences)  . Smokeless tobacco: Never Used  . Alcohol Use: No   Current Outpatient Prescriptions  Medication Sig Dispense Refill  . aspirin 81 MG tablet Take 81 mg by mouth daily.    . Cholecalciferol (VITAMIN D3) 5000 units CAPS Take by mouth.    . Cobalamine Combinations (OPURITY B12/FOLIC ACID)  A999333 MCG TABS Take by mouth.    . Glucosamine-Fish Oil-EPA-DHA 500-400-60-40 MG CAPS Take by mouth.    . hydrochlorothiazide (MICROZIDE) 12.5 MG capsule Take 1 capsule (12.5 mg total) by mouth daily. 90 capsule 3  . lisinopril (PRINIVIL,ZESTRIL) 40 MG tablet Take 1 tablet (40 mg total) by mouth daily. 90 tablet 3  . omeprazole (PRILOSEC) 40 MG capsule TAKE 1 CAPSULE (40 MG TOTAL) BY MOUTH DAILY. 30 capsule 1  . ibuprofen (ADVIL,MOTRIN) 600 MG tablet Take 600 mg by mouth every 6 (six) hours as needed. for pain  0  . oseltamivir (TAMIFLU) 75 MG capsule Take 75 mg by mouth 2 (two) times daily.  0  . promethazine-dextromethorphan (PROMETHAZINE-DM) 6.25-15 MG/5ML syrup TAKE 5 ML BY MOUTH 4 (FOUR) TIMES A DAY AS NEEDED FOR COUGH FOR UP TO 7 DAYS.  0   Current Facility-Administered Medications  Medication Dose Route Frequency Provider Last Rate Last Dose  . 0.9 %  sodium chloride infusion  500 mL Intravenous Continuous Inda Castle, MD       Allergies  Allergen Reactions  . Niacin     REACTION: flushing     Review of Systems: All systems reviewed and negative except where noted in HPI.   No results found for: WBC,  HGB, HCT, MCV, PLT  Lab Results  Component Value Date   CREATININE 1.02 06/29/2015   BUN 18 06/29/2015   NA 144 06/29/2015   K 4.5 06/29/2015   CL 101 06/29/2015   CO2 29 06/29/2015    Lab Results  Component Value Date   ALT 35 05/04/2015   AST 25 05/04/2015   ALKPHOS 93 05/04/2015   BILITOT 0.5 05/04/2015     Physical Exam: BP 168/80 mmHg  Pulse 76  Ht 5\' 9"  (1.753 m)  Wt 273 lb 6.4 oz (124.013 kg)  BMI 40.36 kg/m2 Constitutional: Pleasant,well-developed, male in no acute distress. HEENT: Normocephalic and atraumatic. Conjunctivae are normal. No scleral icterus. Neck supple.  Cardiovascular: Normal rate, regular rhythm.  Pulmonary/chest: Effort normal and breath sounds normal. No wheezing, rales or rhonchi. Abdominal: Soft, protuberant, nontender.  Bowel sounds active throughout. There are no masses palpable. No hepatomegaly. Extremities: no edema Lymphadenopathy: No cervical adenopathy noted. Neurological: Alert and oriented to person place and time. Skin: Skin is warm and dry. No rashes noted. Psychiatric: Normal mood and affect. Behavior is normal.   ASSESSMENT AND PLAN: 65 y/o male with strong family history of colon cancer (mother age 26s) who is due for screening colonoscopy. Last exam done 5 years ago. He is asymptomatic at this time. The indications, risks, and benefits of colonoscopy were explained to the patient in detail. Risks include but are not limited to bleeding, perforation, adverse reaction to medications, and cardiopulmonary compromise. Sequelae include but are not limited to the possibility of surgery, hospitalization, and mortality. The patient verbalized understanding and wished to proceed. All questions answered, referred to the scheduler and bowel prep ordered. Further recommendations pending results of the exam.   We otherwise discussed his history of GERD. Prior EGD showed no evidence of Barrett's. Symptoms well controlled on present dose of PPI. The risks of long term PPIs were discussed with the patient, with current data include increased risk for chronic kidney disease, increased risk of fracture, increased risk of C diff, increased risk of pneumonia (short term usage), potentially increased risk of B12 / calcium deficiency, and rare risk of hypomagnesemia. Recent studies have also shown an association with increased risk of dementia and cardiovascular outcomes including stroke. These studies have showed an association between PPIs and several of these outcomes but no evidence of causality. The patient was counseled to use the lowest daily use of PPI needed to control symptoms. Renal function is currently normal and would recommend checking this yearly while on PPI therapy. For now will change omeprazole to 20mg  daily  and if this continues to work well for him can use it PRN.  [name], MD [facility] Pager [phone]

## 2016-02-14 NOTE — Patient Instructions (Signed)
  It has been recommended to you by your physician that you have a(n) colonoscopy completed. Per your request, we did not schedule the procedure(s) today. Please contact our office at [phone] should you decide to have the procedure completed.   We have sent the following medications to your pharmacy for you to pick up at your convenience: Prilosec, he is decreasing your dosage.    I appreciate the opportunity to care for you.

## 2016-06-03 ENCOUNTER — Telehealth: Payer: Self-pay | Admitting: Cardiology

## 2016-06-03 MED ORDER — HYDROCHLOROTHIAZIDE 12.5 MG PO CAPS: 12.5000 mg | ORAL_CAPSULE | Freq: Every day | ORAL | Status: DC

## 2016-06-03 MED ORDER — LISINOPRIL 40 MG PO TABS: 40.0000 mg | ORAL_TABLET | Freq: Every day | ORAL | Status: DC

## 2016-06-03 NOTE — Telephone Encounter (Signed)
Refill sent to the pharmacy electronically.  

## 2016-06-03 NOTE — Telephone Encounter (Signed)
°*  STAT* If patient is at the pharmacy, call can be transferred to refill team.   1. Which medications need to be refilled? (please list name of each medication and dose if known) Lisnopril 40mg  , HCTX 12.5   2. Which pharmacy/location (including street and city if local pharmacy) is medication to be sent to?[facility] [address]   3. Do they need a 30 day or 90 day supply?[place]

## 2016-11-24 ENCOUNTER — Other Ambulatory Visit: Payer: Self-pay | Admitting: Cardiology

## 2017-02-03 ENCOUNTER — Other Ambulatory Visit: Payer: Self-pay | Admitting: Gastroenterology

## 2017-03-15 ENCOUNTER — Emergency Department (HOSPITAL_COMMUNITY): Payer: Medicare Other

## 2017-03-15 ENCOUNTER — Encounter (HOSPITAL_COMMUNITY): Payer: Self-pay

## 2017-03-15 ENCOUNTER — Emergency Department (HOSPITAL_COMMUNITY)
Admission: EM | Admit: 2017-03-15 | Discharge: 2017-03-15 | Disposition: A | Discharge status: Home / Self Care | Payer: Medicare Other | Attending: Emergency Medicine | Admitting: Emergency Medicine

## 2017-03-15 DIAGNOSIS — I10 Essential (primary) hypertension: Secondary | ICD-10-CM | POA: Diagnosis not present

## 2017-03-15 DIAGNOSIS — Z96651 Presence of right artificial knee joint: Secondary | ICD-10-CM | POA: Insufficient documentation

## 2017-03-15 DIAGNOSIS — R1032 Left lower quadrant pain: Secondary | ICD-10-CM

## 2017-03-15 DIAGNOSIS — Z87891 Personal history of nicotine dependence: Secondary | ICD-10-CM | POA: Insufficient documentation

## 2017-03-15 DIAGNOSIS — Z9114 Patient's other noncompliance with medication regimen: Secondary | ICD-10-CM | POA: Diagnosis not present

## 2017-03-15 DIAGNOSIS — Z7982 Long term (current) use of aspirin: Secondary | ICD-10-CM | POA: Insufficient documentation

## 2017-03-15 DIAGNOSIS — Z85828 Personal history of other malignant neoplasm of skin: Secondary | ICD-10-CM | POA: Insufficient documentation

## 2017-03-15 LAB — COMPREHENSIVE METABOLIC PANEL
ALK PHOS: 94 U/L (ref 38–126)
ALT: 41 U/L (ref 17–63)
AST: 30 U/L (ref 15–41)
Albumin: 4.3 g/dL (ref 3.5–5.0)
Anion gap: 8 (ref 5–15)
BILIRUBIN TOTAL: 0.7 mg/dL (ref 0.3–1.2)
BUN: 14 mg/dL (ref 6–20)
CALCIUM: 9.2 mg/dL (ref 8.9–10.3)
CO2: 29 mmol/L (ref 22–32)
CREATININE: 0.94 mg/dL (ref 0.61–1.24)
Chloride: 102 mmol/L (ref 101–111)
Glucose, Bld: 132 mg/dL — ABNORMAL HIGH (ref 65–99)
Potassium: 3.8 mmol/L (ref 3.5–5.1)
Sodium: 139 mmol/L (ref 135–145)
Total Protein: 8.5 g/dL — ABNORMAL HIGH (ref 6.5–8.1)

## 2017-03-15 LAB — URINALYSIS, ROUTINE W REFLEX MICROSCOPIC
Bacteria, UA: NONE SEEN
Bilirubin Urine: NEGATIVE
GLUCOSE, UA: NEGATIVE mg/dL
Hgb urine dipstick: NEGATIVE
KETONES UR: NEGATIVE mg/dL
Leukocytes, UA: NEGATIVE
Nitrite: NEGATIVE
PH: 6 (ref 5.0–8.0)
SQUAMOUS EPITHELIAL / LPF: NONE SEEN
Specific Gravity, Urine: 1.018 (ref 1.005–1.030)

## 2017-03-15 LAB — LIPASE, BLOOD: LIPASE: 25 U/L (ref 11–51)

## 2017-03-15 LAB — CBC
HEMATOCRIT: 44.4 % (ref 39.0–52.0)
Hemoglobin: 15.3 g/dL (ref 13.0–17.0)
MCH: 30.9 pg (ref 26.0–34.0)
MCHC: 34.5 g/dL (ref 30.0–36.0)
MCV: 89.7 fL (ref 78.0–100.0)
PLATELETS: 259 10*3/uL (ref 150–400)
RBC: 4.95 MIL/uL (ref 4.22–5.81)
RDW: 13.1 % (ref 11.5–15.5)
WBC: 11 10*3/uL — AB (ref 4.0–10.5)

## 2017-03-15 MED ORDER — IOPAMIDOL (ISOVUE-300) INJECTION 61%
100.0000 mL | Freq: Once | INTRAVENOUS | Status: AC | PRN
  Administered 2017-03-15: 100 mL via INTRAVENOUS

## 2017-03-15 MED ORDER — DOCUSATE SODIUM 100 MG PO CAPS: 100.0000 mg | ORAL_CAPSULE | Freq: Two times a day (BID) | ORAL | 0 refills | Status: DC

## 2017-03-15 MED ORDER — FLEET ENEMA 7-19 GM/118ML RE ENEM: 1.0000 | ENEMA | Freq: Once | RECTAL | 0 refills | Status: AC

## 2017-03-15 MED ORDER — OXYCODONE-ACETAMINOPHEN 5-325 MG PO TABS: 1.0000 | ORAL_TABLET | Freq: Four times a day (QID) | ORAL | 0 refills | Status: DC | PRN

## 2017-03-15 MED ORDER — HYDROCHLOROTHIAZIDE 12.5 MG PO TABS: 12.5000 mg | ORAL_TABLET | Freq: Every day | ORAL | 0 refills | Status: DC

## 2017-03-15 MED ORDER — HYDROCHLOROTHIAZIDE 12.5 MG PO CAPS
25.0000 mg | ORAL_CAPSULE | Freq: Once | ORAL | Status: AC
  Administered 2017-03-15: 25 mg via ORAL
  Filled 2017-03-15: qty 2

## 2017-03-15 MED ORDER — IOPAMIDOL (ISOVUE-300) INJECTION 61%
INTRAVENOUS | Status: AC
  Filled 2017-03-15: qty 100

## 2017-03-15 MED ORDER — ONDANSETRON HCL 4 MG/2ML IJ SOLN
4.0000 mg | Freq: Once | INTRAMUSCULAR | Status: AC
  Administered 2017-03-15: 4 mg via INTRAVENOUS
  Filled 2017-03-15: qty 2

## 2017-03-15 MED ORDER — MORPHINE SULFATE (PF) 2 MG/ML IV SOLN
4.0000 mg | Freq: Once | INTRAVENOUS | Status: AC
  Administered 2017-03-15: 4 mg via INTRAVENOUS
  Filled 2017-03-15: qty 2

## 2017-03-15 NOTE — ED Provider Notes (Signed)
  Physical Exam  BP (!) 175/97 (BP Location: Left Arm)   Pulse 66   Temp 98.9 F (37.2 C) (Oral)   Resp 20   Ht 5\' 11"  (1.803 m)   Wt 129.3 kg   SpO2 96%   BMI 39.75 kg/m   Physical Exam  ED Course  Procedures  MDM 6:35 AM- Sign out from Charlann Lange, PA-C  Per previous provider MDM: Patient with LLQ abdominal pain with nausea and vomiting. No melena or bloody stool. No fever. CT scan pending for further evaluation.   Elevated Blood pressure since arrival. He admits to not taking his medications in "a long time". 229/111 on arrival. 175/97 on recheck. Should start HCTZ 25 mg daily on discharge. The importance of this was discussed with the patient during ED evaluation.  Pending CT ABD/Pelvis  6:58 AM CT Abdomen/Pelvis: IMPRESSION: 1. No acute process identified as explanation for abdominal pain. 2. Hepatic steatosis. 3. 5 mm left lower lobe pulmonary nodule. No follow-up needed if patient is low-risk. Non-contrast chest CT can be considered in 12 months if patient is high-risk. This recommendation follows the consensus statement: Guidelines for Management of Incidental Pulmonary Nodules Detected on CT Images: From the Fleischner Society 2017; Radiology 2017; 284:228-243. 4. Mild aortic calcific atherosclerosis. 5. Mild prostate enlargement. 6. Advanced lumbar degenerative changes with multifactorial probably severe canal stenosis at the L3-4 level.   Pain resolved in ED. Possible viral etiology. Plan is to DC home with follow up to PCP. Given Rx HCTZ 25mg  PO. Encouraged follow up with GI doctor as pt has missed colonoscopy. Strict return precautions given. At time of discharge, Patient is in no acute distress. Vital Signs are stable. Patient is able to ambulate. Patient able to tolerate PO.       Shary Decamp, PA-C 03/15/17 [place], MD 03/15/17 [phone]

## 2017-03-15 NOTE — Discharge Instructions (Signed)
Please read and follow all provided instructions.  Your diagnoses today include:  1. Left lower quadrant pain   2. Essential hypertension   3. Hx of medication noncompliance     Tests performed today include: Blood counts and electrolytes Blood tests to check liver and kidney function Blood tests to check pancreas function Urine test to look for infection and pregnancy (in women) Vital signs. See below for your results today.   Medications prescribed:   Take any prescribed medications only as directed.  Home care instructions:  Follow any educational materials contained in this packet.  Follow-up instructions: Please follow-up with your primary care provider in the next 2 days for further evaluation of your symptoms.    Return instructions:  SEEK IMMEDIATE MEDICAL ATTENTION IF: The pain does not go away or becomes severe  A temperature above 101F develops  Repeated vomiting occurs (multiple episodes)  The pain becomes localized to portions of the abdomen. The right side could possibly be appendicitis. In an adult, the left lower portion of the abdomen could be colitis or diverticulitis.  Blood is being passed in stools or vomit (bright red or black tarry stools)  You develop chest pain, difficulty breathing, dizziness or fainting, or become confused, poorly responsive, or inconsolable (young children) If you have any other emergent concerns regarding your health  Additional Information: Abdominal (belly) pain can be caused by many things. Your caregiver performed an examination and possibly ordered blood/urine tests and imaging (CT scan, x-rays, ultrasound). Many cases can be observed and treated at home after initial evaluation in the emergency department. Even though you are being discharged home, abdominal pain can be unpredictable. Therefore, you need a repeated exam if your pain does not resolve, returns, or worsens. Most patients with abdominal pain don't have to be admitted  to the hospital or have surgery, but serious problems like appendicitis and gallbladder attacks can start out as nonspecific pain. Many abdominal conditions cannot be diagnosed in one visit, so follow-up evaluations are very important.  Your vital signs today were: BP (!) 175/97 (BP Location: Left Arm)    Pulse 66    Temp 98.9 F (37.2 C) (Oral)    Resp 20    Ht 5\' 11"  (1.803 m)    Wt 129.3 kg    SpO2 96%    BMI 39.75 kg/m  If your blood pressure (bp) was elevated above 135/85 this visit, please have this repeated by your doctor within one month. --------------

## 2017-03-15 NOTE — ED Triage Notes (Signed)
Pt c/o LLQ abd pain w/o radiation. Pt denies diarrhea or constipation. Pt reports prior episode of nausea w/o emesis.

## 2017-03-15 NOTE — ED Notes (Signed)
ED Provider at bedside. 

## 2017-03-15 NOTE — ED Provider Notes (Signed)
[facility] DEPT Provider Note   CSN: [id number] Arrival date & time: 03/15/17  0407     History   Chief Complaint Chief Complaint  Patient presents with  . Abdominal Pain    HPI [name] is a 66 y.o. male.  Patient with a history of HTN, HLD, obesity presents with LLQ abdominal pain that woke him from sleep yesterday morning (03/14/17). He has nausea with "dry heaving" but no fever, change to his bowel movements, or trouble with urination. He denies similar pain in the past. No SOB or chest pain. He reports he is passing flatus but not like usual. No melena or blood per rectum.   The history is provided by the patient and the spouse. No language interpreter was used.  Abdominal Pain   Associated symptoms include vomiting. Pertinent negatives include fever.    Past Medical History:  Diagnosis Date  . Benign neoplasm of colon   . Esophageal reflux   . Esophagitis, unspecified   . Hemorrhoids   . Hypertension   . Osteoarthrosis, unspecified whether generalized or localized, unspecified site   . Other and unspecified hyperlipidemia   . Skin cancer of nose    malignant skin cancer  . Unspecified essential hypertension     Patient Active Problem List   Diagnosis Date Noted  . Dyspnea 04/26/2015  . Bruit 04/26/2015  . Obesity 11/01/2013  . Family history of malignant neoplasm of gastrointestinal tract 02/28/2011  . SINUS BRADYCARDIA 04/03/2009  . Essential hypertension 03/30/2009  . OSTEOARTHRITIS 03/30/2009  . Adenomatous polyp 04/08/2008  . HYPERLIPIDEMIA 04/08/2008  . ESOPHAGITIS 04/08/2008  . GERD 04/08/2008    Past Surgical History:  Procedure Laterality Date  . COLONOSCOPY    . ENDOSCOPIC LUMBAR DISCECTOMY W/ LASER    . KNEE SURGERY     Bilateral , REPLACED  . microdisctomy  2002   Lumbar spine  . Open knee cartilage repair    . right total knee arthroplasty    . skin cancer removal    . [place]  Medications    Prior to Admission medications   Medication Sig Start Date End Date Taking? Authorizing Provider  aspirin 81 MG tablet Take 81 mg by mouth daily.    Historical Provider, MD  Cholecalciferol (VITAMIN D3) 5000 units CAPS Take by mouth.    Historical Provider, MD  Cobalamine Combinations (OPURITY B12/FOLIC ACID) 7209-470 MCG TABS Take by mouth.    Historical Provider, MD  Glucosamine-Fish Oil-EPA-DHA 962-836-62-94 MG CAPS Take by mouth.    Historical Provider, MD  hydrochlorothiazide (MICROZIDE) 12.5 MG capsule TAKE ONE CAPSULE BY MOUTH EVERY DAY 11/25/16   Lelon Perla, MD  ibuprofen (ADVIL,MOTRIN) 600 MG tablet Take 600 mg by mouth every 6 (six) hours as needed. for pain 02/07/16   Historical Provider, MD  lisinopril (PRINIVIL,ZESTRIL) 40 MG tablet TAKE 1 TABLET BY MOUTH EVERY DAY 11/25/16   Lelon Perla, MD  omeprazole (PRILOSEC) 20 MG capsule TAKE 1 CAPSULE (20 MG TOTAL) BY MOUTH DAILY. 02/03/17   Manus Gunning, MD  oseltamivir (TAMIFLU) 75 MG capsule Take 75 mg by mouth 2 (two) times daily. 02/07/16   Historical Provider, MD  promethazine-dextromethorphan (PROMETHAZINE-DM) 6.25-15 MG/5ML syrup TAKE 5 ML BY MOUTH 4 (FOUR) TIMES A DAY AS NEEDED FOR COUGH FOR UP TO 7 DAYS. 02/08/16   Historical Provider, MD    Family History Family History  Problem Relation Age of Onset  .  Hypertension Mother   . Colon cancer Mother   . Diabetes Mother   . Colon cancer Mother   . Colon cancer Paternal Grandfather   . Colon cancer Paternal Grandfather     Social History Social History  Substance Use Topics  . Smoking status: Former Research scientist (life sciences)  . Smokeless tobacco: Never Used  . Alcohol use No     Allergies   Niacin   Review of Systems Review of Systems  Constitutional: Negative for chills and fever.  HENT: Negative.   Respiratory: Negative.   Cardiovascular: Negative.   Gastrointestinal: Positive for abdominal pain and vomiting. Negative for blood in stool.    Genitourinary: Negative.   Musculoskeletal: Positive for back pain (lower left back.).  Skin: Negative.   Neurological: Negative.  Negative for weakness and light-headedness.     Physical Exam Updated Vital Signs BP (!) 175/97 (BP Location: Left Arm)   Pulse 66   Temp 98.9 F (37.2 C) (Oral)   Resp 20   Ht 5\' 11"  (1.803 m)   Wt 129.3 kg   SpO2 96%   BMI 39.75 kg/m   Physical Exam  Constitutional: He is oriented to person, place, and time. He appears well-developed and well-nourished.  HENT:  Head: Normocephalic.  Neck: Normal range of motion. Neck supple.  Cardiovascular: Normal rate and regular rhythm.   Pulmonary/Chest: Effort normal and breath sounds normal. He has no wheezes. He has no rales.  Abdominal: Soft. Bowel sounds are normal. There is tenderness. There is no rebound and no guarding.  BS diminished in the LLQ.  Musculoskeletal: Normal range of motion.  Neurological: He is alert and oriented to person, place, and time.  Skin: Skin is warm and dry. No rash noted.  Psychiatric: He has a normal mood and affect.     ED Treatments / Results  Labs (all labs ordered are listed, but only abnormal results are displayed) Labs Reviewed  COMPREHENSIVE METABOLIC PANEL - Abnormal; Notable for the following:       Result Value   Glucose, Bld 132 (*)    Total Protein 8.5 (*)    All other components within normal limits  CBC - Abnormal; Notable for the following:    WBC 11.0 (*)    All other components within normal limits  URINALYSIS, ROUTINE W REFLEX MICROSCOPIC - Abnormal; Notable for the following:    Protein, ur >=300 (*)    All other components within normal limits  LIPASE, BLOOD   Results for orders placed or performed during the hospital encounter of 03/15/17  Lipase, blood  Result Value Ref Range   Lipase 25 11 - 51 U/L  Comprehensive metabolic panel  Result Value Ref Range   Sodium 139 135 - 145 mmol/L   Potassium 3.8 3.5 - 5.1 mmol/L   Chloride 102  101 - 111 mmol/L   CO2 29 22 - 32 mmol/L   Glucose, Bld 132 (H) 65 - 99 mg/dL   BUN 14 6 - 20 mg/dL   Creatinine, Ser 0.94 0.61 - 1.24 mg/dL   Calcium 9.2 8.9 - 10.3 mg/dL   Total Protein 8.5 (H) 6.5 - 8.1 g/dL   Albumin 4.3 3.5 - 5.0 g/dL   AST 30 15 - 41 U/L   ALT 41 17 - 63 U/L   Alkaline Phosphatase 94 38 - 126 U/L   Total Bilirubin 0.7 0.3 - 1.2 mg/dL   GFR calc non Af Amer >60 >60 mL/min   GFR calc Af Amer >60 >  60 mL/min   Anion gap 8 5 - 15  CBC  Result Value Ref Range   WBC 11.0 (H) 4.0 - 10.5 K/uL   RBC 4.95 4.22 - 5.81 MIL/uL   Hemoglobin 15.3 13.0 - 17.0 g/dL   HCT 44.4 39.0 - 52.0 %   MCV 89.7 78.0 - 100.0 fL   MCH 30.9 26.0 - 34.0 pg   MCHC 34.5 30.0 - 36.0 g/dL   RDW 13.1 11.5 - 15.5 %   Platelets 259 150 - 400 K/uL  Urinalysis, Routine w reflex microscopic  Result Value Ref Range   Color, Urine YELLOW YELLOW   APPearance CLEAR CLEAR   Specific Gravity, Urine 1.018 1.005 - 1.030   pH 6.0 5.0 - 8.0   Glucose, UA NEGATIVE NEGATIVE mg/dL   Hgb urine dipstick NEGATIVE NEGATIVE   Bilirubin Urine NEGATIVE NEGATIVE   Ketones, ur NEGATIVE NEGATIVE mg/dL   Protein, ur >=300 (A) NEGATIVE mg/dL   Nitrite NEGATIVE NEGATIVE   Leukocytes, UA NEGATIVE NEGATIVE   RBC / HPF 0-5 0 - 5 RBC/hpf   WBC, UA 0-5 0 - 5 WBC/hpf   Bacteria, UA NONE SEEN NONE SEEN   Squamous Epithelial / LPF NONE SEEN NONE SEEN   Mucous PRESENT     EKG  EKG Interpretation None       Radiology No results found.  Procedures Procedures (including critical care time)  Medications Ordered in ED Medications  iopamidol (ISOVUE-300) 61 % injection 100 mL (not administered)  iopamidol (ISOVUE-300) 61 % injection (not administered)  morphine 2 MG/ML injection 4 mg (4 mg Intravenous Given 03/15/17 0527)  ondansetron (ZOFRAN) injection 4 mg (4 mg Intravenous Given 03/15/17 0528)     Initial Impression / Assessment and Plan / ED Course  I have reviewed the triage vital signs and the nursing  notes.  Pertinent labs & imaging results that were available during my care of the patient were reviewed by me and considered in my medical decision making (see chart for details).     Patient with LLQ abdominal pain with nausea and vomiting. No melena or bloody stool. No fever. CT scan pending for further evaluation.   Elevated Blood pressure since arrival. He admits to not taking his medications in "a long time". 229/111 on arrival. 175/97 on recheck. Should start HCTZ 25 mg daily on discharge. The importance of this was discussed with the patient during ED evaluation.  Patient care signed out to Shary Decamp, PA-C with scan pending. Patient is comfortable after IV morphine for pain.  Final Clinical Impressions(s) / ED Diagnoses   Final diagnoses:  None   1. Abdominal pain 2. Hypertension 3. Medication noncompliance  New Prescriptions New Prescriptions   No medications on file     Charlann Lange, Hershal Coria 03/15/17 0630    Lacretia Leigh, MD 03/15/17 2329

## 2017-03-15 NOTE — ED Notes (Signed)
Writer attempted blood draw X 2 unsuccessful.

## 2017-04-21 ENCOUNTER — Ambulatory Visit (INDEPENDENT_AMBULATORY_CARE_PROVIDER_SITE_OTHER): Payer: Medicare Other | Admitting: Gastroenterology

## 2017-04-21 ENCOUNTER — Encounter: Payer: Self-pay | Admitting: Gastroenterology

## 2017-04-21 VITALS — BP 126/70 | HR 64 | Ht 69.0 in | Wt 277.1 lb

## 2017-04-21 DIAGNOSIS — K219 Gastro-esophageal reflux disease without esophagitis: Secondary | ICD-10-CM

## 2017-04-21 DIAGNOSIS — Z800 Family history of malignant neoplasm of digestive organs: Secondary | ICD-10-CM

## 2017-04-21 DIAGNOSIS — K760 Fatty (change of) liver, not elsewhere classified: Secondary | ICD-10-CM | POA: Diagnosis not present

## 2017-04-21 MED ORDER — NA SULFATE-K SULFATE-MG SULF 17.5-3.13-1.6 GM/177ML PO SOLN: 1.0000 | Freq: Once | ORAL | 0 refills | Status: AC

## 2017-04-21 NOTE — Progress Notes (Signed)
HPI :  66 year old male, here for follow-up visit.   Patient was scheduled for colonoscopy at last clinic visit but did not have it done. Seen in the ER for abdominal pain on 03/15/2017. CT scan showed hepatic steatosis, small pulmonary nodule. No pathology to explain symptoms.  He thought he had RLQ pain. Acute onset which resolved on it's own. No recurrence. No pain at present. No changes in his bowels. No blood in the stools. Eating well, no trouble.  Mother had colon cancer diagnosed in late 40s.   Taking omeprazole and controls his symptoms well. No dysphagia.  Fatty liver - endorses social alcohol use. No routine drinking. He reports weighing higher than usual. NO FH of liver disease. She is obese and has had difficulty losing weight in the past. He is seen nutritionist in the past, declines further evaluation with nutritionist.  Echo 05/11/2015 - EG 55-60%  Colonoscopy 2012 - normal EGD 2006 - normal    Past Medical History:  Diagnosis Date  . Benign neoplasm of colon   . Esophageal reflux   . Esophagitis, unspecified   . Hemorrhoids   . Hypertension   . Osteoarthrosis, unspecified whether generalized or localized, unspecified site   . Other and unspecified hyperlipidemia   . Skin cancer of nose    malignant skin cancer  . Unspecified essential hypertension      Past Surgical History:  Procedure Laterality Date  . COLONOSCOPY    . ENDOSCOPIC LUMBAR DISCECTOMY W/ LASER    . KNEE SURGERY     Bilateral , REPLACED  . microdisctomy  2002   Lumbar spine  . Open knee cartilage repair    . right total knee arthroplasty    . skin cancer removal    . WISDOM TOOTH EXTRACTION  1975   Family History  Problem Relation Age of Onset  . Hypertension Mother   . Colon cancer Mother   . Diabetes Mother   . Colon cancer Paternal Grandfather    Social History  Substance Use Topics  . Smoking status: Former Research scientist (life sciences)  . Smokeless tobacco: Never Used  . Alcohol use No    Current Outpatient Prescriptions  Medication Sig Dispense Refill  . aspirin 81 MG tablet Take 81 mg by mouth daily.    . hydrochlorothiazide (HYDRODIURIL) 12.5 MG tablet Take 1 tablet (12.5 mg total) by mouth daily. 30 tablet 0  . lisinopril (PRINIVIL,ZESTRIL) 40 MG tablet TAKE 1 TABLET BY MOUTH EVERY DAY 90 tablet 1  . omeprazole (PRILOSEC) 20 MG capsule TAKE 1 CAPSULE (20 MG TOTAL) BY MOUTH DAILY. 90 capsule 3  . ibuprofen (ADVIL,MOTRIN) 600 MG tablet Take 600 mg by mouth as needed. for pain  0   Current Facility-Administered Medications  Medication Dose Route Frequency Provider Last Rate Last Dose  . 0.9 %  sodium chloride infusion  500 mL Intravenous Continuous Inda Castle, MD       Allergies  Allergen Reactions  . Niacin     REACTION: flushing     Review of Systems: All systems reviewed and negative except where noted in HPI.   Lab Results  Component Value Date   WBC 11.0 (H) 03/15/2017   HGB 15.3 03/15/2017   HCT 44.4 03/15/2017   MCV 89.7 03/15/2017   PLT 259 03/15/2017    Lab Results  Component Value Date   CREATININE 0.94 03/15/2017   BUN 14 03/15/2017   NA 139 03/15/2017   K 3.8 03/15/2017   CL 102  03/15/2017   CO2 29 03/15/2017    Lab Results  Component Value Date   ALT 41 03/15/2017   AST 30 03/15/2017   ALKPHOS 94 03/15/2017   BILITOT 0.7 03/15/2017     Physical Exam: BP 126/70 (BP Location: Left Arm, Patient Position: Sitting, Cuff Size: Normal)   Pulse 64   Ht 5\' 9"  (1.753 m)   Wt 277 lb 2 oz (125.7 kg)   BMI 40.92 kg/m  Constitutional: Pleasant,well-developed, male in no acute distress. HEENT: Normocephalic and atraumatic. Conjunctivae are normal. No scleral icterus. Neck supple.  Cardiovascular: Normal rate, regular rhythm.  Pulmonary/chest: Effort normal and breath sounds normal. No wheezing, rales or rhonchi. Abdominal: Soft, protuberant / obese abdomen, nontender.  There are no masses palpable. No hepatomegaly. Extremities:  no edema Lymphadenopathy: No cervical adenopathy noted. Neurological: Alert and oriented to person place and time. Skin: Skin is warm and dry. No rashes noted. Psychiatric: Normal mood and affect. Behavior is normal.   ASSESSMENT AND PLAN: 66 year old male here for reassessment following issues:  Family history of colon cancer - due for screening colonoscopy at this time. Discussed risks and benefits of colonoscopy, following this discussed he wished to proceed.  Morbid obesity / fatty liver - ALT at upper limit of normal. Discussed fatty liver disease in general and long-term risks of [place]/cirrhosis. Also discussed his obesity. He reports the majority of his meals come from [employer]. I offered him a repeat referral to see nutritionist which he declined, stating he "knows what he needs to do". He is to work on diet to previous weight loss or both disease issues and minimize alcohol intake. Recommending trending LFTs at least yearly moving forward.  GERD - continue omeprazole as needed, prior EGD without Barrett's, no alarm symptoms  [place] Cellar, MD [facility] Pager [phone]

## 2017-04-21 NOTE — Patient Instructions (Signed)
If you are age 67 or older, your body mass index should be between 23-30. Your Body mass index is 40.92 kg/m. If this is out of the aforementioned range listed, please consider follow up with your Primary Care Provider.  If you are age 74 or younger, your body mass index should be between 19-25. Your Body mass index is 40.92 kg/m. If this is out of the aformentioned range listed, please consider follow up with your Primary Care Provider.   We have sent the following medications to your pharmacy for you to pick up at your convenience:  [place] have been scheduled for a colonoscopy. Please follow written instructions given to you at your visit today.  Please pick up your prep supplies at the pharmacy within the next 1-3 days. If you use inhalers (even only as needed), please bring them with you on the day of your procedure. Your physician has requested that you go to www.startemmi.com and enter the access code given to you at your visit today. This web site gives a general overview about your procedure. However, you should still follow specific instructions given to you by our office regarding your preparation for the procedure.  Thank you.

## 2017-04-24 ENCOUNTER — Telehealth: Payer: Self-pay

## 2017-04-24 ENCOUNTER — Encounter: Payer: Self-pay | Admitting: Gastroenterology

## 2017-04-24 NOTE — Telephone Encounter (Signed)
Pt states that his Suprep is $92. He has MCR so he cannot use the rebate card we have in the office. Do you want to switch to Miralax prep or I can wait until closer to his procedure date that is in June and give him a sample of Suprep?

## 2017-04-25 NOTE — Telephone Encounter (Signed)
Pt made aware that Dr. Havery Moros prefers [place]. I informed pt of this and that he can have a sample of the prep. He would just need to call about 1.5 weeks before his procedure that is scheduled in June and I will leave it up front for him to pick up. Pt understood.

## 2017-04-25 NOTE — Telephone Encounter (Signed)
Either way - Miralax prep or free sample of Suprep close to the procedure. I would prefer Suprep if possible. Thanks

## 2017-05-07 ENCOUNTER — Telehealth: Payer: Self-pay | Admitting: Cardiology

## 2017-05-07 NOTE — Telephone Encounter (Signed)
Left message for patient, the issue with refills is he has not been seen since 2016 and he will need to call and make an appointment for follow to get refills. He was given the option of getting his refills from his medical doctor or to call and make appointment.

## 2017-05-07 NOTE — Telephone Encounter (Signed)
New message       *STAT* If patient is at the pharmacy, call can be transferred to refill team.   1. Which medications need to be refilled? (please list name of each medication and dose if known)  Lisinopril and HCTZ 2. Which pharmacy/location (including street and city if local pharmacy) is medication to be sent to? [facility] on [address]. Do they need a 30 day or 90 day supply? 30 day Pt need a refill but says the pharmacy denied the request.  Pt want to talk to a nurse to get the medication refilled

## 2017-05-21 ENCOUNTER — Other Ambulatory Visit: Payer: Self-pay | Admitting: Cardiology

## 2017-06-02 ENCOUNTER — Telehealth: Payer: Self-pay | Admitting: Gastroenterology

## 2017-06-02 ENCOUNTER — Other Ambulatory Visit: Payer: Self-pay | Admitting: Cardiology

## 2017-06-02 NOTE — Telephone Encounter (Signed)
Left message informing patient of this.

## 2017-06-02 NOTE — Telephone Encounter (Signed)
Rx(s) sent to pharmacy electronically.  

## 2017-06-02 NOTE — Telephone Encounter (Signed)
Can you please call him and tell him that there is a sample of front for him. Thank you!!

## 2017-06-05 ENCOUNTER — Encounter: Payer: Self-pay | Admitting: Gastroenterology

## 2017-06-06 ENCOUNTER — Ambulatory Visit (AMBULATORY_SURGERY_CENTER): Payer: Medicare Other | Admitting: Gastroenterology

## 2017-06-06 ENCOUNTER — Encounter: Payer: Self-pay | Admitting: Gastroenterology

## 2017-06-06 VITALS — BP 102/77 | HR 56 | Temp 98.7°F | Resp 17 | Ht 69.0 in | Wt 277.0 lb

## 2017-06-06 DIAGNOSIS — Z800 Family history of malignant neoplasm of digestive organs: Secondary | ICD-10-CM

## 2017-06-06 DIAGNOSIS — K635 Polyp of colon: Secondary | ICD-10-CM | POA: Diagnosis not present

## 2017-06-06 DIAGNOSIS — D122 Benign neoplasm of ascending colon: Secondary | ICD-10-CM

## 2017-06-06 DIAGNOSIS — D126 Benign neoplasm of colon, unspecified: Secondary | ICD-10-CM | POA: Diagnosis not present

## 2017-06-06 DIAGNOSIS — D123 Benign neoplasm of transverse colon: Secondary | ICD-10-CM | POA: Diagnosis not present

## 2017-06-06 DIAGNOSIS — Z86010 Personal history of colonic polyps: Secondary | ICD-10-CM

## 2017-06-06 MED ORDER — SODIUM CHLORIDE 0.9 % IV SOLN: 500.0000 mL | INTRAVENOUS | Status: DC

## 2017-06-06 NOTE — Progress Notes (Signed)
Called to room to assist during endoscopic procedure.  Patient ID and intended procedure confirmed with present staff. Received instructions for my participation in the procedure from the performing physician.  

## 2017-06-06 NOTE — Progress Notes (Signed)
Alert and oriented x3, pleased with MAC, report to RN Megan 

## 2017-06-06 NOTE — Op Note (Signed)
[name] Patient Name: [name] Procedure Date: 06/06/2017 1:56 PM MRN: [id number] Endoscopist: Remo Lipps P. Yamile Roedl MD, MD Age: 66 Referring MD:  Date of Birth: 09/11/1951 Gender: Male Account #: [id number] Procedure:                Colonoscopy Indications:              Screening in patient at increased risk: Family                            history of 1st-degree relative with colorectal                            cancer (mother age 53s) Medicines:                Monitored Anesthesia Care Procedure:                Pre-Anesthesia Assessment:                           - Prior to the procedure, a History and Physical                            was performed, and patient medications and                            allergies were reviewed. The patient's tolerance of                            previous anesthesia was also reviewed. The risks                            and benefits of the procedure and the sedation                            options and risks were discussed with the patient.                            All questions were answered, and informed consent                            was obtained. Prior Anticoagulants: The patient has                            taken aspirin, last dose was 1 day prior to                            procedure. ASA Grade Assessment: III - A patient                            with severe systemic disease. After reviewing the                            risks and benefits, the patient was deemed in  satisfactory condition to undergo the procedure.                           After obtaining informed consent, the colonoscope                            was passed under direct vision. Throughout the                            procedure, the patient's blood pressure, pulse, and                            oxygen saturations were monitored continuously. The                            Colonoscope was introduced  through the anus and                            advanced to the the cecum, identified by                            appendiceal orifice and ileocecal valve. The                            colonoscopy was performed without difficulty. The                            patient tolerated the procedure well. The quality                            of the bowel preparation was good. The ileocecal                            valve, appendiceal orifice, and rectum were                            photographed. Scope In: 2:01:20 PM Scope Out: 2:18:10 PM Scope Withdrawal Time: 0 hours 12 minutes 37 seconds  Total Procedure Duration: 0 hours 16 minutes 50 seconds  Findings:                 The perianal and digital rectal examinations were                            normal.                           A 4 mm polyp was found in the ascending colon. The                            polyp was sessile. The polyp was removed with a                            cold snare. Resection and retrieval were complete.  A 4 mm polyp was found in the hepatic flexure. The                            polyp was sessile. The polyp was removed with a                            cold snare. Resection and retrieval were complete.                           Two sessile polyps were found in the transverse                            colon. The polyps were 3 to 4 mm in size. These                            polyps were removed with a cold snare. Resection                            and retrieval were complete.                           A few small-mouthed diverticula were found in the                            left colon.                           The exam was otherwise without abnormality on                            direct and retroflexion views. Complications:            No immediate complications. Estimated blood loss:                            Minimal. Estimated Blood Loss:     Estimated blood loss was  minimal. Impression:               - One 4 mm polyp in the ascending colon, removed                            with a cold snare. Resected and retrieved.                           - One 4 mm polyp at the hepatic flexure, removed                            with a cold snare. Resected and retrieved.                           - Two 3 to 4 mm polyps in the transverse colon,                            removed with  a cold snare. Resected and retrieved.                           - Diverticulosis in the left colon.                           - The examination was otherwise normal on direct                            and retroflexion views. Recommendation:           - Patient has a contact number available for                            emergencies. The signs and symptoms of potential                            delayed complications were discussed with the                            patient. Return to normal activities tomorrow.                            Written discharge instructions were provided to the                            patient.                           - Resume previous diet.                           - Continue present medications.                           - Await pathology results.                           - Repeat colonoscopy is recommended for                            surveillance. The colonoscopy date will be                            determined after pathology results from today's                            exam become available for review.                           - No ibuprofen, naproxen, or other non-steroidal                            anti-inflammatory drugs for 2 weeks after polyp                            removal. Remo Lipps P.  Kyndell Zeiser MD, MD 06/06/2017 2:25:29 PM This report has been signed electronically.

## 2017-06-06 NOTE — Patient Instructions (Signed)
Handout given on polyps  YOU HAD AN ENDOSCOPIC PROCEDURE TODAY: Refer to the procedure report and other information in the discharge instructions given to you for any specific questions about what was found during the examination. If this information does not answer your questions, please call [place] office at [phone] to clarify.   YOU SHOULD EXPECT: Some feelings of bloating in the abdomen. Passage of more gas than usual. Walking can help get rid of the air that was put into your GI tract during the procedure and reduce the bloating. If you had a lower endoscopy (such as a colonoscopy or flexible sigmoidoscopy) you may notice spotting of blood in your stool or on the toilet paper. Some abdominal soreness may be present for a day or two, also.  DIET: Your first meal following the procedure should be a light meal and then it is ok to progress to your normal diet. A half-sandwich or bowl of soup is an example of a good first meal. Heavy or fried foods are harder to digest and may make you feel nauseous or bloated. Drink plenty of fluids but you should avoid alcoholic beverages for 24 hours. If you had a esophageal dilation, please see attached instructions for diet.    ACTIVITY: Your care partner should take you home directly after the procedure. You should plan to take it easy, moving slowly for the rest of the day. You can resume normal activity the day after the procedure however YOU SHOULD NOT DRIVE, use power tools, machinery or perform tasks that involve climbing or major physical exertion for 24 hours (because of the sedation medicines used during the test).   SYMPTOMS TO REPORT IMMEDIATELY: A gastroenterologist can be reached at any hour. Please call [phone]  for any of the following symptoms:  Following lower endoscopy (colonoscopy, flexible sigmoidoscopy) Excessive amounts of blood in the stool  Significant tenderness, worsening of abdominal pains  Swelling of the abdomen that is  new, acute  Fever of 100 or higher    FOLLOW UP:  If any biopsies were taken you will be contacted by phone or by letter within the next 1-3 weeks. Call [phone]  if you have not heard about the biopsies in 3 weeks.  Please also call with any specific questions about appointments or follow up tests.  

## 2017-06-09 ENCOUNTER — Telehealth: Payer: Self-pay

## 2017-06-09 NOTE — Telephone Encounter (Signed)
  Follow up Call-  Call back number 06/06/2017  Post procedure Call Back phone  # [phone]  Permission to leave phone message Yes  Some recent data might be hidden     Patient questions:  Do you have a fever, pain , or abdominal swelling? No. Pain Score  0 *  Have you tolerated food without any problems? Yes.    Have you been able to return to your normal activities? Yes.    Do you have any questions about your discharge instructions: Diet   No. Medications  No. Follow up visit  No.  Do you have questions or concerns about your Care? No.  Actions: * If pain score is 4 or above: No action needed, pain <4.

## 2017-06-13 ENCOUNTER — Encounter: Payer: Self-pay | Admitting: Gastroenterology

## 2017-07-18 ENCOUNTER — Other Ambulatory Visit: Payer: Self-pay | Admitting: Cardiology

## 2017-07-18 NOTE — Telephone Encounter (Signed)
New Message     Has appt 9/26     *STAT* If patient is at the pharmacy, call can be transferred to refill team.   1. Which medications need to be refilled? (please list name of each medication and dose if known)  lisinopril (PRINIVIL,ZESTRIL) 40 MG tablet Take 1 tablet (40 mg total) by mouth daily. <PLEASE MAKE APPOINTMENT FOR REFILLS>   hydrochlorothiazide (MICROZIDE) 12.5 MG capsule Take 1 capsule (12.5 mg total) by mouth daily. <PLEASE MAKE APPOINTMENT FOR REFILLS>      2. Which pharmacy/location (including street and city if local pharmacy) is medication to be sent to?  [facility] [address]  3. Do they need a 30 day or 90 day supply? [place]

## 2017-07-21 MED ORDER — HYDROCHLOROTHIAZIDE 12.5 MG PO CAPS: 12.5000 mg | ORAL_CAPSULE | Freq: Every day | ORAL | 0 refills | Status: DC

## 2017-07-21 MED ORDER — LISINOPRIL 40 MG PO TABS: 40.0000 mg | ORAL_TABLET | Freq: Every day | ORAL | 0 refills | Status: DC

## 2017-07-21 NOTE — Telephone Encounter (Signed)
patient directly notified

## 2017-07-23 ENCOUNTER — Other Ambulatory Visit: Payer: Self-pay | Admitting: Cardiology

## 2017-07-23 ENCOUNTER — Encounter: Payer: Self-pay | Admitting: Cardiology

## 2017-07-23 ENCOUNTER — Ambulatory Visit (INDEPENDENT_AMBULATORY_CARE_PROVIDER_SITE_OTHER): Payer: Medicare Other | Admitting: Cardiology

## 2017-07-23 VITALS — BP 128/79 | HR 57 | Ht 69.0 in | Wt 279.1 lb

## 2017-07-23 DIAGNOSIS — E7800 Pure hypercholesterolemia, unspecified: Secondary | ICD-10-CM

## 2017-07-23 DIAGNOSIS — R0602 Shortness of breath: Secondary | ICD-10-CM

## 2017-07-23 DIAGNOSIS — I10 Essential (primary) hypertension: Secondary | ICD-10-CM | POA: Diagnosis not present

## 2017-07-23 NOTE — Patient Instructions (Signed)
Medication Instructions:   NO CHANGE  Labwork:  Your physician recommends that you HAVE LAB WORK TODAY  Follow-Up:  Your physician wants you to follow-up in: ONE YEAR WITH DR CRENSHAW You will receive a reminder letter in the mail two months in advance. If you don't receive a letter, please call our office to schedule the follow-up appointment.   If you need a refill on your cardiac medications before your next appointment, please call your pharmacy.    

## 2017-07-23 NOTE — Progress Notes (Signed)
HPI: Fu hypertension and hyperlipidemia. Echo 6/16 showed normal LV function, mild LVH/LVE and grade 1 diastolic dysfunction. Myoview in June of 2016 showed no ischemia and an ejection fraction of 50%. Abd ultrasound 6/16 showed no aneurysm. Abdominal CT April 2018 showed 5 mm left lower lobe pulmonary nodule. Follow-up noncontrast chest CT could be considered at 12 months. Since last seen, the patient has dyspnea with more extreme activities but not with routine activities. It is relieved with rest. It is not associated with chest pain. There is no orthopnea, PND or pedal edema. There is no syncope or palpitations. There is no exertional chest pain.   Current Outpatient Prescriptions  Medication Sig Dispense Refill  . aspirin 81 MG tablet Take 81 mg by mouth daily.    . hydrochlorothiazide (MICROZIDE) 12.5 MG capsule Take 1 capsule (12.5 mg total) by mouth daily. 90 capsule 0  . ibuprofen (ADVIL,MOTRIN) 600 MG tablet Take 600 mg by mouth as needed. for pain  0  . lisinopril (PRINIVIL,ZESTRIL) 40 MG tablet Take 1 tablet (40 mg total) by mouth daily. 90 tablet 0  . omeprazole (PRILOSEC) 20 MG capsule TAKE 1 CAPSULE (20 MG TOTAL) BY MOUTH DAILY. 90 capsule 3   Current Facility-Administered Medications  Medication Dose Route Frequency Provider Last Rate Last Dose  . 0.9 %  sodium chloride infusion  500 mL Intravenous Continuous Inda Castle, MD      . 0.9 %  sodium chloride infusion  500 mL Intravenous Continuous Armbruster, Renelda Loma, MD         Past Medical History:  Diagnosis Date  . Benign neoplasm of colon   . Esophageal reflux   . Esophagitis, unspecified   . Hemorrhoids   . Hypertension   . Osteoarthrosis, unspecified whether generalized or localized, unspecified site   . Other and unspecified hyperlipidemia   . Skin cancer of nose    malignant skin cancer  . Unspecified essential hypertension     Past Surgical History:  Procedure Laterality Date  . COLONOSCOPY     . ENDOSCOPIC LUMBAR DISCECTOMY W/ LASER    . KNEE SURGERY     Bilateral , REPLACED  . microdisctomy  2002   Lumbar spine  . Open knee cartilage repair    . right total knee arthroplasty    . skin cancer removal    . WISDOM TOOTH EXTRACTION  1975    Social History   Social History  . Marital status: Legally Separated    Spouse name: N/A  . Number of children: N/A  . Years of education: N/A   Occupational History  . Employed     Self Employed--Financial    Social History Main Topics  . Smoking status: Former Research scientist (life sciences)  . Smokeless tobacco: Never Used  . Alcohol use Yes     Comment: OCCASIONALLY  . Drug use: No  . Sexual activity: Not on file   Other Topics Concern  . Not on file   Social History Narrative  . No narrative on file    Family History  Problem Relation Age of Onset  . Hypertension Mother   . Colon cancer Mother   . Diabetes Mother   . Heart failure Father   . Colon cancer Paternal Grandfather     ROS: no fevers or chills, productive cough, hemoptysis, dysphasia, odynophagia, melena, hematochezia, dysuria, hematuria, rash, seizure activity, orthopnea, PND, pedal edema, claudication. Remaining systems are negative.  Physical Exam: Well-developed obese in no acute  distress.  Skin is warm and dry.  HEENT is normal.  Neck is supple.  Chest is clear to auscultation with normal expansion.  Cardiovascular exam is regular rate and rhythm.  Abdominal exam nontender or distended. No masses palpated. Extremities show no edema. neuro grossly intact  ECG-Sinus rhythm at a rate of 57. Lateral T-wave inversion. Unchanged compared to 04/26/2015. personally reviewed  A/P  1 Hypertension-blood pressure is controlled. Continue present medications. Check potassium and renal function.   2 hyperlipidemia-check lipids and liver.  3 dyspnea-patient continues to have some dyspnea. Likely component of obesity and deconditioning. I discussed lifestyle modification.  Previous nuclear study negative and echo showed normal LV function.  4 obesity-we discussed the importance of weight loss.  [name], MD

## 2017-07-24 LAB — LIPID PANEL
CHOLESTEROL TOTAL: 158 mg/dL (ref 100–199)
Chol/HDL Ratio: 5.1 ratio — ABNORMAL HIGH (ref 0.0–5.0)
HDL: 31 mg/dL — AB (ref >39)
LDL Calculated: 89 mg/dL (ref 0–99)
TRIGLYCERIDES: 189 mg/dL — AB (ref 0–149)
VLDL CHOLESTEROL CAL: 38 mg/dL (ref 5–40)

## 2017-07-24 LAB — COMPREHENSIVE METABOLIC PANEL
A/G RATIO: 1.4 (ref 1.2–2.2)
ALT: 37 IU/L (ref 0–44)
AST: 26 IU/L (ref 0–40)
Albumin: 4.2 g/dL (ref 3.6–4.8)
Alkaline Phosphatase: 89 IU/L (ref 39–117)
BILIRUBIN TOTAL: 0.4 mg/dL (ref 0.0–1.2)
BUN/Creatinine Ratio: 16 (ref 10–24)
BUN: 15 mg/dL (ref 8–27)
CHLORIDE: 98 mmol/L (ref 96–106)
CO2: 30 mmol/L — ABNORMAL HIGH (ref 20–29)
Calcium: 9.4 mg/dL (ref 8.6–10.2)
Creatinine, Ser: 0.93 mg/dL (ref 0.76–1.27)
GFR calc non Af Amer: 85 mL/min/{1.73_m2} (ref >59)
GFR, EST AFRICAN AMERICAN: 99 mL/min/{1.73_m2} (ref >59)
GLUCOSE: 100 mg/dL — AB (ref 65–99)
Globulin, Total: 2.9 g/dL (ref 1.5–4.5)
POTASSIUM: 4.7 mmol/L (ref 3.5–5.2)
Sodium: 140 mmol/L (ref 134–144)
TOTAL PROTEIN: 7.1 g/dL (ref 6.0–8.5)

## 2017-07-29 ENCOUNTER — Encounter: Payer: Self-pay | Admitting: *Deleted

## 2017-09-03 ENCOUNTER — Ambulatory Visit: Payer: Medicare Other | Admitting: Cardiology

## 2017-10-15 ENCOUNTER — Other Ambulatory Visit: Payer: Self-pay | Admitting: Cardiology

## 2017-10-19 ENCOUNTER — Other Ambulatory Visit: Payer: Self-pay | Admitting: Cardiology

## 2017-10-19 ENCOUNTER — Other Ambulatory Visit: Payer: Self-pay | Admitting: Gastroenterology

## 2018-01-17 ENCOUNTER — Other Ambulatory Visit: Payer: Self-pay | Admitting: Cardiology

## 2018-01-19 NOTE — Telephone Encounter (Signed)
REFILL 

## 2018-02-07 ENCOUNTER — Other Ambulatory Visit: Payer: Self-pay | Admitting: Cardiology

## 2018-04-09 ENCOUNTER — Other Ambulatory Visit: Payer: Self-pay | Admitting: Cardiology

## 2018-04-09 ENCOUNTER — Other Ambulatory Visit: Payer: Self-pay | Admitting: Gastroenterology

## 2018-07-08 ENCOUNTER — Other Ambulatory Visit: Payer: Self-pay | Admitting: Gastroenterology

## 2018-07-08 NOTE — Telephone Encounter (Signed)
Ok to fill omeprazole for 3 to 6 months? Last seen 04-2017 with colonoscopy in 05-2017. No follow up appt scheduled.

## 2018-07-08 NOTE — Telephone Encounter (Signed)
Thanks Jan. You can refill it for 6 months. Hopefully he can follow up with [place] within that time frame. Thanks

## 2018-10-06 ENCOUNTER — Other Ambulatory Visit: Payer: Self-pay | Admitting: Cardiology

## 2018-10-08 ENCOUNTER — Other Ambulatory Visit: Payer: Self-pay | Admitting: Gastroenterology

## 2018-12-29 ENCOUNTER — Other Ambulatory Visit: Payer: Self-pay | Admitting: Cardiology

## 2018-12-29 ENCOUNTER — Other Ambulatory Visit: Payer: Self-pay | Admitting: Gastroenterology

## 2018-12-30 ENCOUNTER — Other Ambulatory Visit: Payer: Self-pay | Admitting: Cardiology

## 2019-01-22 ENCOUNTER — Other Ambulatory Visit: Payer: Self-pay | Admitting: Cardiology

## 2019-02-17 ENCOUNTER — Other Ambulatory Visit: Payer: Self-pay | Admitting: Cardiology

## 2019-02-28 ENCOUNTER — Other Ambulatory Visit: Payer: Self-pay | Admitting: Cardiology

## 2019-03-26 ENCOUNTER — Other Ambulatory Visit: Payer: Self-pay | Admitting: Cardiology

## 2019-03-26 NOTE — Telephone Encounter (Signed)
Lisinopril 40 mg and HCTZ 12.5 mg refilled.

## 2019-03-27 ENCOUNTER — Other Ambulatory Visit: Payer: Self-pay | Admitting: Gastroenterology

## 2019-03-29 NOTE — Telephone Encounter (Signed)
Called and schedule pt for an OV on 4-21 at 2:00pm for med refill. Last seen 2018.

## 2019-03-30 ENCOUNTER — Other Ambulatory Visit: Payer: Self-pay

## 2019-03-30 ENCOUNTER — Ambulatory Visit (INDEPENDENT_AMBULATORY_CARE_PROVIDER_SITE_OTHER): Payer: Medicare Other | Admitting: Gastroenterology

## 2019-03-30 DIAGNOSIS — K219 Gastro-esophageal reflux disease without esophagitis: Secondary | ICD-10-CM

## 2019-03-30 DIAGNOSIS — Z86010 Personal history of colonic polyps: Secondary | ICD-10-CM | POA: Diagnosis not present

## 2019-03-30 DIAGNOSIS — K760 Fatty (change of) liver, not elsewhere classified: Secondary | ICD-10-CM | POA: Diagnosis not present

## 2019-03-30 DIAGNOSIS — Z800 Family history of malignant neoplasm of digestive organs: Secondary | ICD-10-CM

## 2019-03-30 MED ORDER — OMEPRAZOLE 20 MG PO CPDR: 20.0000 mg | DELAYED_RELEASE_CAPSULE | Freq: Every day | ORAL | 3 refills | Status: DC

## 2019-03-30 NOTE — Progress Notes (Addendum)
THIS ENCOUNTER IS A VIRTUAL VISIT DUE TO COVID-19 - PATIENT WAS NOT SEEN IN THE OFFICE. PATIENT HAS CONSENTED TO VIRTUAL VISIT / TELEMEDICINE VISIT. ATTEMPTED TO USE DOXIMITY APP BUT THE PATIENT WAS DRIVING AND ASKED TO USE AUDIO ONLY   Location of patient: home Location of provider: office Persons participating: myself, patient Total time 15 minutes  HPI :  68 y/o male here for a follow up visit. Since I've last seen him he had a colonoscopy in June 2018, he had 4 small polyps removed, 2 of which were adenomas. Mild diverticulosis.   He's doing well. No trouble with his bowels. No blood in the stools. Mother had colon cancer diagnosed in late 84s.   In regards to his GERD, he had been doing well until he ran out of medication about 2 months ago, on omeprazole 20mg  once daily. He thinks this was working well to control symptoms. No dysphagia. He was having some breakthrough without the meds. He thinks he felt much better on it and hoping to resume this. He had an EGD in 2006 which showed no BE.   Otherwise he has a history of fatty liver noted on prior CT scan, in the setting of BMI of 41 - last labs Aug 2018. No routine alcohol use. He reports weight about 275 lbs. No weight loss, he's had a difficult time with that. No FH of liver disease.  Colonoscopy 06/06/2017 - 4 small polyps - 2 adenomas, diverticulosis in the left colon - recall at 4 years Colonoscopy 2012 - normal EGD 2006 - normal   Past Medical History:  Diagnosis Date  . Benign neoplasm of colon   . Esophageal reflux   . Esophagitis, unspecified   . Hemorrhoids   . Hypertension   . Osteoarthrosis, unspecified whether generalized or localized, unspecified site   . Other and unspecified hyperlipidemia   . Skin cancer of nose    malignant skin cancer  . Unspecified essential hypertension      Past Surgical History:  Procedure Laterality Date  . COLONOSCOPY    . ENDOSCOPIC LUMBAR DISCECTOMY W/ LASER    . KNEE  SURGERY     Bilateral , REPLACED  . microdisctomy  2002   Lumbar spine  . Open knee cartilage repair    . right total knee arthroplasty    . skin cancer removal    . WISDOM TOOTH EXTRACTION  1975   Family History  Problem Relation Age of Onset  . Hypertension Mother   . Colon cancer Mother   . Diabetes Mother   . Heart failure Father   . Colon cancer Paternal Grandfather    Social History   Tobacco Use  . Smoking status: Former Research scientist (life sciences)  . Smokeless tobacco: Never Used  Substance Use Topics  . Alcohol use: Yes    Comment: OCCASIONALLY  . Drug use: No   Current Outpatient Medications  Medication Sig Dispense Refill  . aspirin 81 MG tablet Take 81 mg by mouth daily.    . hydrochlorothiazide (MICROZIDE) 12.5 MG capsule TAKE 1 CAPSULE BY MOUTH EVERY DAY 30 capsule 3  . ibuprofen (ADVIL,MOTRIN) 600 MG tablet Take 600 mg by mouth as needed. for pain  0  . lisinopril (ZESTRIL) 40 MG tablet TAKE 1 TABLET (40 MG TOTAL) BY MOUTH DAILY. PLEASE SCHEDULE APPOINTMENT FOR REFILLS 2ND ATTEMPT 30 tablet 0  . omeprazole (PRILOSEC) 20 MG capsule Take 1 capsule (20 mg total) by mouth daily. Please call our office to  make an appt with Dr. Havery Moros for further refills: [phone]. 90 capsule 1   No current facility-administered medications for this visit.    Allergies  Allergen Reactions  . Niacin     REACTION: flushing     Review of Systems: All systems reviewed and negative except where noted in HPI.   Lab Results  Component Value Date   WBC 11.0 (H) 03/15/2017   HGB 15.3 03/15/2017   HCT 44.4 03/15/2017   MCV 89.7 03/15/2017   PLT 259 03/15/2017    Lab Results  Component Value Date   CREATININE 0.93 07/23/2017   BUN 15 07/23/2017   NA 140 07/23/2017   K 4.7 07/23/2017   CL 98 07/23/2017   CO2 30 (H) 07/23/2017    Lab Results  Component Value Date   ALT 37 07/23/2017   AST 26 07/23/2017   ALKPHOS 89 07/23/2017   BILITOT 0.4 07/23/2017     Physical Exam: There  were no vitals taken for this visit.  NA  ASSESSMENT AND PLAN: 68 y/o male here for reassessment of the following issues:  GERD - well controlled on omeprazole 20mg  once daily, ran out and now with recurrent symptoms. No alarm symptoms / dysphagia, he had no evidence of BE on prior EGD in 2006. Counseled him on risks / benefits of chronic PPI use, he understands and wishes to continue with therapy, will refill. He can follow up PRN for this issue.  Fatty liver / morbid obesity - fatty liver noted incidentally on prior liver imaging. His ALT last checked in 2018 was normal. I counseled him on fatty liver, risks for NASH and cirrhosis. Recommend checking LFTs yearly to keep on eye on this. He is not drinking alcohol. Long term recommend weight loss for his obesity / fatty liver and overall health and we discussed this for a bit. He understands, working on this issue, has declined assistance with this in the past.   History of colon polyps / family history of colon cancer - while only 2 small adenomas on last colonoscopy, will plan on surveillance in 5 years given family history of CRC, due 05/2022. He agreed.  Gueydan Cellar, MD [facility] Gastroenterology

## 2019-03-30 NOTE — Patient Instructions (Addendum)
If you are age 69 or older, your body mass index should be between 23-30. Your There is no height or weight on file to calculate BMI. If this is out of the aforementioned range listed, please consider follow up with your Primary Care Provider.  If you are age 74 or younger, your body mass index should be between 19-25. Your There is no height or weight on file to calculate BMI. If this is out of the aformentioned range listed, please consider follow up with your Primary Care Provider.   We have sent the following medications to your pharmacy for you to pick up at your convenience: Omeprazole 20mg : Take once daily  Please go to the lab in the basement of our building to have lab work done. Hit "B" for basement when you get on the elevator.  When the doors open the lab is on your left.  We will call you with the results. Thank you.  Thank you for entrusting me with your care and for choosing [facility], Dr. [place] Cellar

## 2019-04-17 ENCOUNTER — Other Ambulatory Visit: Payer: Self-pay | Admitting: Cardiology

## 2019-04-19 NOTE — Telephone Encounter (Signed)
Refill; please schedule fu with me [name]

## 2019-04-19 NOTE — Telephone Encounter (Signed)
Please refill and schedule fuov [name]

## 2019-04-28 ENCOUNTER — Other Ambulatory Visit: Payer: Self-pay | Admitting: Cardiology

## 2019-05-17 ENCOUNTER — Other Ambulatory Visit: Payer: Self-pay | Admitting: Cardiology

## 2019-06-02 NOTE — Addendum Note (Signed)
Addended by: Yetta Flock on: 06/02/2019 02:37 PM   Modules accepted: Level of Service

## 2019-06-18 ENCOUNTER — Other Ambulatory Visit: Payer: Self-pay | Admitting: Cardiology

## 2019-07-26 ENCOUNTER — Other Ambulatory Visit: Payer: Self-pay | Admitting: Cardiology

## 2019-07-27 MED ORDER — HYDROCHLOROTHIAZIDE 12.5 MG PO CAPS: ORAL_CAPSULE | ORAL | 0 refills | Status: DC

## 2019-07-27 MED ORDER — LISINOPRIL 40 MG PO TABS: 40.0000 mg | ORAL_TABLET | Freq: Every day | ORAL | 0 refills | Status: DC

## 2019-10-19 NOTE — Progress Notes (Signed)
HPI: Fu hypertension and hyperlipidemia.  Last seen August 2018.  Echo 6/16 showed normal LV function, mild LVH/LVE and grade 1 diastolic dysfunction. Myoview in June of 2016 showed no ischemia and an ejection fraction of 50%. Abd ultrasound 6/16 showed no aneurysm. Since last seen, patient has some dyspnea on exertion but no orthopnea, PND, pedal edema, chest pain or syncope.  Current Outpatient Medications  Medication Sig Dispense Refill  . aspirin 81 MG tablet Take 81 mg by mouth daily.    . hydrochlorothiazide (MICROZIDE) 12.5 MG capsule TAKE 1 CAPSULE BY MOUTH EVERY DAY 90 capsule 0  . ibuprofen (ADVIL,MOTRIN) 600 MG tablet Take 600 mg by mouth as needed. for pain  0  . lisinopril (ZESTRIL) 40 MG tablet Take 1 tablet (40 mg total) by mouth daily. KEEP OV. 90 tablet 0  . omeprazole (PRILOSEC) 20 MG capsule Take 1 capsule (20 mg total) by mouth daily. . 90 capsule 3   No current facility-administered medications for this visit.      Past Medical History:  Diagnosis Date  . Benign neoplasm of colon   . Esophageal reflux   . Esophagitis, unspecified   . Hemorrhoids   . Hypertension   . Osteoarthrosis, unspecified whether generalized or localized, unspecified site   . Other and unspecified hyperlipidemia   . Skin cancer of nose    malignant skin cancer  . Unspecified essential hypertension     Past Surgical History:  Procedure Laterality Date  . COLONOSCOPY    . ENDOSCOPIC LUMBAR DISCECTOMY W/ LASER    . KNEE SURGERY     Bilateral , REPLACED  . microdisctomy  2002   Lumbar spine  . Open knee cartilage repair    . right total knee arthroplasty    . skin cancer removal    . WISDOM TOOTH EXTRACTION  1975    Social History   Socioeconomic History  . Marital status: Legally Separated    Spouse name: Not on file  . Number of children: Not on file  . Years of education: Not on file  . Highest education level: Not on file  Occupational History  . Occupation:  Employed    Comment: Self Employed--Financial   Social Needs  . Financial resource strain: Not on file  . Food insecurity    Worry: Not on file    Inability: Not on file  . Transportation needs    Medical: Not on file    Non-medical: Not on file  Tobacco Use  . Smoking status: Former Research scientist (life sciences)  . Smokeless tobacco: Never Used  Substance and Sexual Activity  . Alcohol use: Yes    Comment: OCCASIONALLY  . Drug use: No  . Sexual activity: Not on file  Lifestyle  . Physical activity    Days per week: Not on file    Minutes per session: Not on file  . Stress: Not on file  Relationships  . Social Herbalist on phone: Not on file    Gets together: Not on file    Attends religious service: Not on file    Active member of club or organization: Not on file    Attends meetings of clubs or organizations: Not on file    Relationship status: Not on file  . Intimate partner violence    Fear of current or ex partner: Not on file    Emotionally abused: Not on file    Physically abused: Not on file  Forced sexual activity: Not on file  Other Topics Concern  . Not on file  Social History Narrative  . Not on file    Family History  Problem Relation Age of Onset  . Hypertension Mother   . Colon cancer Mother   . Diabetes Mother   . Heart failure Father   . Colon cancer Paternal Grandfather     ROS: no fevers or chills, productive cough, hemoptysis, dysphasia, odynophagia, melena, hematochezia, dysuria, hematuria, rash, seizure activity, orthopnea, PND, pedal edema, claudication. Remaining systems are negative.  Physical Exam: Well-developed obese in no acute distress.  Skin is warm and dry.  HEENT is normal.  Neck is supple.  Chest is clear to auscultation with normal expansion.  Cardiovascular exam is regular rate and rhythm.  Abdominal exam nontender or distended. No masses palpated. Extremities show no edema. neuro grossly intact  ECG-sinus rhythm at a rate of  63, no ST changes.  Personally reviewed  A/P  1 hypertension-patient's blood pressure is controlled.  Continue present medications and follow.  Check potassium and renal function.  2 hyperlipidemia-check lipids.  If elevated we will consider resuming statin.  Patient has also requested hemoglobin A1c.  3 obesity-we discussed the importance of diet, exercise and weight loss.  4 dyspnea-there is likely component of deconditioning and obesity hypoventilation syndrome.  We discussed weight loss and lifestyle modification.  Previous nuclear study showed no ischemia.  However patient also has a family history of coronary disease.  We will arrange a calcium score.   We have arranged for patient to see Dr. Alain Marion of primary care as he has not had an evaluation recently.  [name], MD

## 2019-10-27 ENCOUNTER — Other Ambulatory Visit: Payer: Self-pay

## 2019-10-27 ENCOUNTER — Telehealth: Payer: Self-pay | Admitting: Internal Medicine

## 2019-10-27 ENCOUNTER — Encounter: Payer: Self-pay | Admitting: Cardiology

## 2019-10-27 ENCOUNTER — Ambulatory Visit (INDEPENDENT_AMBULATORY_CARE_PROVIDER_SITE_OTHER): Payer: Medicare Other | Admitting: Cardiology

## 2019-10-27 VITALS — BP 135/70 | HR 66 | Ht 69.0 in | Wt 271.4 lb

## 2019-10-27 DIAGNOSIS — R0609 Other forms of dyspnea: Secondary | ICD-10-CM

## 2019-10-27 DIAGNOSIS — I10 Essential (primary) hypertension: Secondary | ICD-10-CM

## 2019-10-27 DIAGNOSIS — E7800 Pure hypercholesterolemia, unspecified: Secondary | ICD-10-CM | POA: Diagnosis not present

## 2019-10-27 DIAGNOSIS — Z8249 Family history of ischemic heart disease and other diseases of the circulatory system: Secondary | ICD-10-CM

## 2019-10-27 DIAGNOSIS — R0600 Dyspnea, unspecified: Secondary | ICD-10-CM | POA: Diagnosis not present

## 2019-10-27 NOTE — Patient Instructions (Signed)
Medication Instructions:  NO CHANGE *If you need a refill on your cardiac medications before your next appointment, please call your pharmacy*  Lab Work: Your physician recommends that you return for lab work PRIOR TO EATING  If you have labs (blood work) drawn today and your tests are completely normal, you will receive your results only by: Marland Kitchen MyChart Message (if you have MyChart) OR . A paper copy in the mail If you have any lab test that is abnormal or we need to change your treatment, we will call you to review the results.  Testing/Procedures: CARDIAC CALCIUM SCORE AT [place]  Follow-Up: At [facility], you and your health needs are our priority.  As part of our continuing mission to provide you with exceptional heart care, we have created designated Provider Care Teams.  These Care Teams include your primary Cardiologist (physician) and Advanced Practice Providers (APPs -  Physician Assistants and Nurse Practitioners) who all work together to provide you with the care you need, when you need it.  Your next appointment:   12 month(s)  The format for your next appointment:   In Person  Provider:   [name], MD  Other Instructions SCHEDULE FOLLOW UP WITH DR Alain Marion

## 2019-10-27 NOTE — Telephone Encounter (Signed)
Would you be willing to see this patient to re-establish care? Heartcare would like for him to follow up with a PCP (see message below).  Please advise.

## 2019-10-27 NOTE — Telephone Encounter (Signed)
Copied from [place] [phone]. Topic: Appointment Scheduling - Scheduling Inquiry for Clinic >> Oct 27, 2019 12:50 PM Alanda Slim E wrote: Reason for CRM: Heart care called to schedule an appt for the Pt. His cardiologist wanted him to follow up with Dr. Alain Marion since he hasn't seen him in a while. Pt hasn't seen PCP in a long time and if he can still schedule with Dr. Alain Marion please advise/ office was not available when call attempt was made

## 2019-10-31 NOTE — Telephone Encounter (Signed)
Okay.  Thanks.

## 2019-11-01 NOTE — Telephone Encounter (Signed)
Left message for patient to call back to schedule.  °

## 2019-11-03 ENCOUNTER — Other Ambulatory Visit: Payer: Self-pay

## 2019-11-03 ENCOUNTER — Ambulatory Visit (INDEPENDENT_AMBULATORY_CARE_PROVIDER_SITE_OTHER)
Admission: RE | Admit: 2019-11-03 | Discharge: 2019-11-03 | Disposition: A | Discharge status: Home / Self Care | Payer: Self-pay | Source: Ambulatory Visit | Attending: Cardiology | Admitting: Cardiology

## 2019-11-03 DIAGNOSIS — Z8249 Family history of ischemic heart disease and other diseases of the circulatory system: Secondary | ICD-10-CM

## 2019-11-08 ENCOUNTER — Telehealth: Payer: Self-pay | Admitting: *Deleted

## 2019-11-08 NOTE — Telephone Encounter (Addendum)
Left message for pt to call    ----- Message from Lelon Perla, MD sent at 11/04/2019  7:31 AM EST ----- CA score elevated; add crestor 40 mg daily; lipids and liver 12 weeks; schedule lexiscan nuclear study [name]

## 2019-11-15 ENCOUNTER — Other Ambulatory Visit: Payer: Self-pay | Admitting: Cardiology

## 2019-11-16 ENCOUNTER — Encounter: Payer: Self-pay | Admitting: *Deleted

## 2019-11-16 ENCOUNTER — Other Ambulatory Visit: Payer: Self-pay | Admitting: *Deleted

## 2019-11-16 DIAGNOSIS — Z13220 Encounter for screening for lipoid disorders: Secondary | ICD-10-CM

## 2019-11-16 DIAGNOSIS — I10 Essential (primary) hypertension: Secondary | ICD-10-CM

## 2019-11-16 DIAGNOSIS — Z131 Encounter for screening for diabetes mellitus: Secondary | ICD-10-CM

## 2019-11-17 NOTE — Telephone Encounter (Signed)
Letter mailed to patient asking him to call to discuss.

## 2019-11-19 LAB — LIPID PANEL
Chol/HDL Ratio: 5.6 ratio — ABNORMAL HIGH (ref 0.0–5.0)
Cholesterol, Total: 156 mg/dL (ref 100–199)
HDL: 28 mg/dL — ABNORMAL LOW (ref >39)
LDL Chol Calc (NIH): 97 mg/dL (ref 0–99)
Triglycerides: 176 mg/dL — ABNORMAL HIGH (ref 0–149)
VLDL Cholesterol Cal: 31 mg/dL (ref 5–40)

## 2019-11-19 LAB — BASIC METABOLIC PANEL
BUN/Creatinine Ratio: 18 (ref 10–24)
BUN: 20 mg/dL (ref 8–27)
CO2: 27 mmol/L (ref 20–29)
Calcium: 10 mg/dL (ref 8.6–10.2)
Chloride: 102 mmol/L (ref 96–106)
Creatinine, Ser: 1.11 mg/dL (ref 0.76–1.27)
GFR calc Af Amer: 78 mL/min/{1.73_m2} (ref >59)
GFR calc non Af Amer: 68 mL/min/{1.73_m2} (ref >59)
Glucose: 101 mg/dL — ABNORMAL HIGH (ref 65–99)
Potassium: 4.7 mmol/L (ref 3.5–5.2)
Sodium: 142 mmol/L (ref 134–144)

## 2019-11-19 LAB — HEMOGLOBIN A1C
Est. average glucose Bld gHb Est-mCnc: 128 mg/dL
Hgb A1c MFr Bld: 6.1 % — ABNORMAL HIGH (ref 4.8–5.6)

## 2019-12-15 ENCOUNTER — Encounter: Payer: Self-pay | Admitting: *Deleted

## 2020-01-15 DIAGNOSIS — H9203 Otalgia, bilateral: Secondary | ICD-10-CM | POA: Diagnosis not present

## 2020-01-15 DIAGNOSIS — J018 Other acute sinusitis: Secondary | ICD-10-CM | POA: Diagnosis not present

## 2020-01-15 DIAGNOSIS — J302 Other seasonal allergic rhinitis: Secondary | ICD-10-CM | POA: Diagnosis not present

## 2020-03-05 ENCOUNTER — Other Ambulatory Visit: Payer: Self-pay | Admitting: Gastroenterology

## 2020-03-28 ENCOUNTER — Other Ambulatory Visit: Payer: Self-pay | Admitting: Gastroenterology

## 2020-04-22 ENCOUNTER — Other Ambulatory Visit: Payer: Self-pay | Admitting: Gastroenterology

## 2020-05-04 DIAGNOSIS — M25512 Pain in left shoulder: Secondary | ICD-10-CM | POA: Diagnosis not present

## 2020-05-17 ENCOUNTER — Other Ambulatory Visit: Payer: Self-pay

## 2020-05-17 ENCOUNTER — Other Ambulatory Visit: Payer: Self-pay | Admitting: Cardiology

## 2020-05-17 MED ORDER — HYDROCHLOROTHIAZIDE 12.5 MG PO CAPS: ORAL_CAPSULE | ORAL | 0 refills | Status: DC

## 2020-05-19 ENCOUNTER — Other Ambulatory Visit: Payer: Self-pay | Admitting: Orthopedic Surgery

## 2020-05-19 DIAGNOSIS — M25512 Pain in left shoulder: Secondary | ICD-10-CM | POA: Diagnosis not present

## 2020-05-19 DIAGNOSIS — M25511 Pain in right shoulder: Secondary | ICD-10-CM

## 2020-05-21 ENCOUNTER — Other Ambulatory Visit: Payer: Self-pay | Admitting: Gastroenterology

## 2020-05-25 NOTE — Progress Notes (Signed)
HPI: Fu hypertension and hyperlipidemia.  Last seen August 2018.  Echo 6/16 showed normal LV function, mild LVH/LVE and grade 1 diastolic dysfunction. Myoview in June of 2016 showed no ischemia and an ejection fraction of 50%. Abd ultrasound 6/16 showed no aneurysm.Ca score 11/20 587. Since last seen,  patient notes some fatigue.  He also complains of dyspnea on exertion.  No orthopnea, PND, pedal edema, chest pain or syncope.  Current Outpatient Medications  Medication Sig Dispense Refill  . aspirin 81 MG tablet Take 81 mg by mouth daily.    . hydrochlorothiazide (MICROZIDE) 12.5 MG capsule TAKE 1 CAPSULE BY MOUTH EVERY DAY 90 capsule 0  . lisinopril (ZESTRIL) 40 MG tablet Take 1 tablet (40 mg total) by mouth daily. 90 tablet 3  . omeprazole (PRILOSEC) 20 MG capsule TAKE 1 CAPSULE DAILY. PLEASE SCHEDULE A YEARLY OFFICE VISIT FOR ANY FURTHER REFILLS. THANK YOU 30 capsule 0   No current facility-administered medications for this visit.     Past Medical History:  Diagnosis Date  . Benign neoplasm of colon   . Esophageal reflux   . Esophagitis, unspecified   . Hemorrhoids   . Hypertension   . Osteoarthrosis, unspecified whether generalized or localized, unspecified site   . Other and unspecified hyperlipidemia   . Skin cancer of nose    malignant skin cancer  . Unspecified essential hypertension     Past Surgical History:  Procedure Laterality Date  . COLONOSCOPY    . ENDOSCOPIC LUMBAR DISCECTOMY W/ LASER    . KNEE SURGERY     Bilateral , REPLACED  . microdisctomy  2002   Lumbar spine  . Open knee cartilage repair    . right total knee arthroplasty    . skin cancer removal    . WISDOM TOOTH EXTRACTION  1975    Social History   Socioeconomic History  . Marital status: Legally Separated    Spouse name: Not on file  . Number of children: Not on file  . Years of education: Not on file  . Highest education level: Not on file  Occupational History  . Occupation:  Employed    Comment: Self Employed--Financial   Tobacco Use  . Smoking status: Former Research scientist (life sciences)  . Smokeless tobacco: Never Used  Substance and Sexual Activity  . Alcohol use: Yes    Comment: OCCASIONALLY  . Drug use: No  . Sexual activity: Not on file  Other Topics Concern  . Not on file  Social History Narrative  . Not on file   Social Determinants of Health   Financial Resource Strain:   . Difficulty of Paying Living Expenses:   Food Insecurity:   . Worried About Charity fundraiser in the Last Year:   . Arboriculturist in the Last Year:   Transportation Needs:   . Film/video editor (Medical):   Marland Kitchen Lack of Transportation (Non-Medical):   Physical Activity:   . Days of Exercise per Week:   . Minutes of Exercise per Session:   Stress:   . Feeling of Stress :   Social Connections:   . Frequency of Communication with Friends and Family:   . Frequency of Social Gatherings with Friends and Family:   . Attends Religious Services:   . Active Member of Clubs or Organizations:   . Attends Archivist Meetings:   Marland Kitchen Marital Status:   Intimate Partner Violence:   . Fear of Current or Ex-Partner:   .  Emotionally Abused:   Marland Kitchen Physically Abused:   . Sexually Abused:     Family History  Problem Relation Age of Onset  . Hypertension Mother   . Colon cancer Mother   . Diabetes Mother   . Heart failure Father   . Colon cancer Paternal Grandfather     ROS: no fevers or chills, productive cough, hemoptysis, dysphasia, odynophagia, melena, hematochezia, dysuria, hematuria, rash, seizure activity, orthopnea, PND, pedal edema, claudication. Remaining systems are negative.  Physical Exam: Well-developed well-nourished in no acute distress.  Skin is warm and dry.  HEENT is normal.  Neck is supple.  Chest is clear to auscultation with normal expansion.  Cardiovascular exam is regular rate and rhythm.  Abdominal exam nontender or distended. No masses  palpated. Extremities show no edema. neuro grossly intact  ECG- personally reviewed  A/P  1 CAD-based on prior Ca score. No CP; continue ASA and resume statin.  2 hypertension-BP controlled; continue present meds.  3 hyperlipidemia-resume Crestor 40 mg daily.  Check lipids and liver in 12 months.  4 obesity-we discussed importance of diet and exercise for weight loss.  5 dyspnea-will repeat nuclear study to screen for ischemia contribution.  Patient also snores.  We will arrange an evaluation for sleep apnea.  He also requests that we arrange follow-up with primary care.  [name], MD

## 2020-05-31 ENCOUNTER — Other Ambulatory Visit: Payer: Self-pay

## 2020-05-31 ENCOUNTER — Ambulatory Visit: Payer: Medicare Other | Admitting: Cardiology

## 2020-05-31 ENCOUNTER — Encounter: Payer: Self-pay | Admitting: Cardiology

## 2020-05-31 ENCOUNTER — Encounter: Payer: Self-pay | Admitting: *Deleted

## 2020-05-31 VITALS — BP 114/74 | HR 56 | Ht 72.0 in | Wt 274.0 lb

## 2020-05-31 DIAGNOSIS — R0683 Snoring: Secondary | ICD-10-CM | POA: Diagnosis not present

## 2020-05-31 DIAGNOSIS — M25512 Pain in left shoulder: Secondary | ICD-10-CM | POA: Diagnosis not present

## 2020-05-31 DIAGNOSIS — R0609 Other forms of dyspnea: Secondary | ICD-10-CM

## 2020-05-31 DIAGNOSIS — E7800 Pure hypercholesterolemia, unspecified: Secondary | ICD-10-CM | POA: Diagnosis not present

## 2020-05-31 DIAGNOSIS — R0600 Dyspnea, unspecified: Secondary | ICD-10-CM | POA: Diagnosis not present

## 2020-05-31 DIAGNOSIS — I10 Essential (primary) hypertension: Secondary | ICD-10-CM

## 2020-05-31 DIAGNOSIS — I2510 Atherosclerotic heart disease of native coronary artery without angina pectoris: Secondary | ICD-10-CM

## 2020-05-31 MED ORDER — ROSUVASTATIN CALCIUM 20 MG PO TABS: 20.0000 mg | ORAL_TABLET | Freq: Every day | ORAL | 3 refills | Status: DC

## 2020-05-31 NOTE — Patient Instructions (Addendum)
Medication Instructions:   START ROSUVASTATIN 20 MG ONCE DAILY  *If you need a refill on your cardiac medications before your next appointment, please call your pharmacy*   Lab Work:  Your physician recommends that you return for lab work in: [place]  If you have labs (blood work) drawn today and your tests are completely normal, you will receive your results only by: Marland Kitchen MyChart Message (if you have MyChart) OR . A paper copy in the mail If you have any lab test that is abnormal or we need to change your treatment, we will call you to review the results.   Testing/Procedures: Your physician has requested that you have a lexiscan myoview. For further information please visit [email or web address]. Please follow instruction sheet, as given.[place]     Follow-Up: At [facility], you and your health needs are our priority.  As part of our continuing mission to provide you with exceptional heart care, we have created designated Provider Care Teams.  These Care Teams include your primary Cardiologist (physician) and Advanced Practice Providers (APPs -  Physician Assistants and Nurse Practitioners) who all work together to provide you with the care you need, when you need it.  We recommend signing up for the patient portal called "MyChart".  Sign up information is provided on this After Visit Summary.  MyChart is used to connect with patients for Virtual Visits (Telemedicine).  Patients are able to view lab/test results, encounter notes, upcoming appointments, etc.  Non-urgent messages can be sent to your provider as well.   To learn more about what you can do with MyChart, go to [email or web address].    Your next appointment:   6 month(s)  The format for your next appointment:   In Person  Provider:   [name], MD   Other Instructions  REFERRAL TO [place] AT [facility]

## 2020-06-05 ENCOUNTER — Telehealth: Payer: Self-pay | Admitting: General Practice

## 2020-06-05 ENCOUNTER — Telehealth (HOSPITAL_COMMUNITY): Payer: Self-pay | Admitting: *Deleted

## 2020-06-05 NOTE — Telephone Encounter (Signed)
New Message:    LVM for patient to call back and schedule a NP appt with Dr. Camila Li.

## 2020-06-05 NOTE — Telephone Encounter (Signed)
Patient given detailed instructions per Myocardial Perfusion Study Information Sheet for the test on 06/09/20 at 10:30. Patient notified to arrive 15 minutes early and that it is imperative to arrive on time for appointment to keep from having the test rescheduled.  If you need to cancel or reschedule your appointment, please call the office within 24 hours of your appointment. . Patient verbalized understanding.[name]

## 2020-06-06 ENCOUNTER — Ambulatory Visit
Admission: RE | Admit: 2020-06-06 | Discharge: 2020-06-06 | Disposition: A | Discharge status: Home / Self Care | Payer: Medicare Other | Source: Ambulatory Visit | Attending: Orthopedic Surgery | Admitting: Orthopedic Surgery

## 2020-06-06 DIAGNOSIS — M25511 Pain in right shoulder: Secondary | ICD-10-CM

## 2020-06-06 DIAGNOSIS — Z01818 Encounter for other preprocedural examination: Secondary | ICD-10-CM | POA: Diagnosis not present

## 2020-06-06 DIAGNOSIS — M6258 Muscle wasting and atrophy, not elsewhere classified, other site: Secondary | ICD-10-CM | POA: Diagnosis not present

## 2020-06-06 DIAGNOSIS — M19011 Primary osteoarthritis, right shoulder: Secondary | ICD-10-CM | POA: Diagnosis not present

## 2020-06-08 DIAGNOSIS — M25512 Pain in left shoulder: Secondary | ICD-10-CM | POA: Diagnosis not present

## 2020-06-08 DIAGNOSIS — M25511 Pain in right shoulder: Secondary | ICD-10-CM | POA: Diagnosis not present

## 2020-06-08 DIAGNOSIS — M25812 Other specified joint disorders, left shoulder: Secondary | ICD-10-CM | POA: Diagnosis not present

## 2020-06-09 ENCOUNTER — Other Ambulatory Visit: Payer: Self-pay

## 2020-06-09 ENCOUNTER — Ambulatory Visit (HOSPITAL_COMMUNITY): Payer: Medicare Other | Attending: Cardiovascular Disease

## 2020-06-09 DIAGNOSIS — R0600 Dyspnea, unspecified: Secondary | ICD-10-CM | POA: Insufficient documentation

## 2020-06-09 DIAGNOSIS — R0609 Other forms of dyspnea: Secondary | ICD-10-CM

## 2020-06-09 LAB — MYOCARDIAL PERFUSION IMAGING
LV dias vol: 73 mL (ref 62–150)
LV sys vol: 32 mL
Peak HR: 70 {beats}/min
Rest HR: 58 {beats}/min
SDS: 2
SRS: 0
SSS: 2
TID: 0.89

## 2020-06-09 MED ORDER — TECHNETIUM TC 99M TETROFOSMIN IV KIT
31.5000 | PACK | Freq: Once | INTRAVENOUS | Status: AC | PRN
  Administered 2020-06-09: 31.5 via INTRAVENOUS
  Filled 2020-06-09: qty 32

## 2020-06-09 MED ORDER — TECHNETIUM TC 99M TETROFOSMIN IV KIT
9.8000 | PACK | Freq: Once | INTRAVENOUS | Status: AC | PRN
  Administered 2020-06-09: 9.8 via INTRAVENOUS
  Filled 2020-06-09: qty 10

## 2020-06-09 MED ORDER — REGADENOSON 0.4 MG/5ML IV SOLN
0.4000 mg | Freq: Once | INTRAVENOUS | Status: AC
  Administered 2020-06-09: 0.4 mg via INTRAVENOUS

## 2020-06-14 ENCOUNTER — Other Ambulatory Visit: Payer: Self-pay | Admitting: Gastroenterology

## 2020-07-08 ENCOUNTER — Other Ambulatory Visit: Payer: Self-pay | Admitting: Gastroenterology

## 2020-07-28 ENCOUNTER — Other Ambulatory Visit: Payer: Self-pay | Admitting: Cardiology

## 2020-08-01 ENCOUNTER — Other Ambulatory Visit: Payer: Self-pay | Admitting: Gastroenterology

## 2020-08-11 ENCOUNTER — Telehealth: Payer: Self-pay | Admitting: Gastroenterology

## 2020-08-11 NOTE — Telephone Encounter (Signed)
Omeprazole refill.

## 2020-08-15 NOTE — Telephone Encounter (Signed)
Called and spoke with patient to advise he said he will call back

## 2020-10-23 ENCOUNTER — Other Ambulatory Visit: Payer: Self-pay | Admitting: Cardiology

## 2020-10-23 NOTE — Telephone Encounter (Signed)
Rx has been sent to the pharmacy electronically. ° °

## 2020-11-14 NOTE — Progress Notes (Signed)
HPI: Fu hypertension and hyperlipidemia. Echo 6/16 showed normal LV function, mild LVH/LVE and grade 1 diastolic dysfunction. Abd ultrasound 6/16 showed no aneurysm.Ca score 11/20 587. Nuclear study 7/21 showed EF 56 and no ischemia. Since last seen,there is no dyspnea, chest pain, palpitations or syncope.  Current Outpatient Medications  Medication Sig Dispense Refill  . aspirin 81 MG tablet Take 81 mg by mouth daily.    . hydrochlorothiazide (MICROZIDE) 12.5 MG capsule TAKE 1 CAPSULE BY MOUTH EVERY DAY 90 capsule 3  . lisinopril (ZESTRIL) 40 MG tablet TAKE 1 TABLET BY MOUTH EVERY DAY 90 tablet 1  . omeprazole (PRILOSEC) 20 MG capsule TAKE 1 CAPSULE DAILY. PLEASE SCHEDULE A YEARLY OFFICE VISIT FOR ANY FURTHER REFILLS. THANK YOU 30 capsule 0  . rosuvastatin (CRESTOR) 20 MG tablet Take 1 tablet (20 mg total) by mouth daily. 90 tablet 3   No current facility-administered medications for this visit.     Past Medical History:  Diagnosis Date  . Benign neoplasm of colon   . Esophageal reflux   . Esophagitis, unspecified   . Hemorrhoids   . Hypertension   . Osteoarthrosis, unspecified whether generalized or localized, unspecified site   . Other and unspecified hyperlipidemia   . Skin cancer of nose    malignant skin cancer  . Unspecified essential hypertension     Past Surgical History:  Procedure Laterality Date  . COLONOSCOPY    . ENDOSCOPIC LUMBAR DISCECTOMY W/ LASER    . KNEE SURGERY     Bilateral , REPLACED  . microdisctomy  2002   Lumbar spine  . Open knee cartilage repair    . right total knee arthroplasty    . skin cancer removal    . WISDOM TOOTH EXTRACTION  1975    Social History   Socioeconomic History  . Marital status: Single    Spouse name: Not on file  . Number of children: Not on file  . Years of education: Not on file  . Highest education level: Not on file  Occupational History  . Occupation: Employed    Comment: Self Employed--Financial    Tobacco Use  . Smoking status: Former Research scientist (life sciences)  . Smokeless tobacco: Never Used  Substance and Sexual Activity  . Alcohol use: Yes    Comment: OCCASIONALLY  . Drug use: No  . Sexual activity: Not on file  Other Topics Concern  . Not on file  Social History Narrative  . Not on file   Social Determinants of Health   Financial Resource Strain: Not on file  Food Insecurity: Not on file  Transportation Needs: Not on file  Physical Activity: Not on file  Stress: Not on file  Social Connections: Not on file  Intimate Partner Violence: Not on file    Family History  Problem Relation Age of Onset  . Hypertension Mother   . Colon cancer Mother   . Diabetes Mother   . Heart failure Father   . Colon cancer Paternal Grandfather     ROS: no fevers or chills, productive cough, hemoptysis, dysphasia, odynophagia, melena, hematochezia, dysuria, hematuria, rash, seizure activity, orthopnea, PND, pedal edema, claudication. Remaining systems are negative.  Physical Exam: Well-developed obese in no acute distress.  Skin is warm and dry.  HEENT is normal.  Neck is supple.  Chest is clear to auscultation with normal expansion.  Cardiovascular exam is regular rate and rhythm.  Abdominal exam nontender or distended. No masses palpated. Extremities show no edema.  neuro grossly intact  ECG-sinus bradycardia at a rate of 58, occasional PVC, no ST changes.  Personally reviewed  A/P  1 coronary artery disease-based on previous elevated calcium score.  Previous nuclear study showed no ischemia.  Continue medical therapy with aspirin and statin.  2 hypertension-patient's blood pressure is borderline.  I have asked him to follow this at home and we will add medications as needed.  Check potassium and renal function.  3 hyperlipidemia-continue Crestor.  Check lipids and liver.  4 obesity-we discussed the importance of diet, exercise and weight loss.  5 history of snoring-we will again arrange  evaluation by pulmonary for possible sleep apnea.  [name], MD

## 2020-11-22 ENCOUNTER — Ambulatory Visit: Payer: Medicare Other | Admitting: Cardiology

## 2020-11-22 ENCOUNTER — Encounter: Payer: Self-pay | Admitting: Cardiology

## 2020-11-22 ENCOUNTER — Other Ambulatory Visit: Payer: Self-pay

## 2020-11-22 VITALS — BP 136/80 | HR 58 | Wt 270.0 lb

## 2020-11-22 DIAGNOSIS — I10 Essential (primary) hypertension: Secondary | ICD-10-CM | POA: Diagnosis not present

## 2020-11-22 DIAGNOSIS — R0683 Snoring: Secondary | ICD-10-CM

## 2020-11-22 DIAGNOSIS — I2510 Atherosclerotic heart disease of native coronary artery without angina pectoris: Secondary | ICD-10-CM | POA: Diagnosis not present

## 2020-11-22 DIAGNOSIS — E7800 Pure hypercholesterolemia, unspecified: Secondary | ICD-10-CM | POA: Diagnosis not present

## 2020-11-22 NOTE — Patient Instructions (Signed)

## 2021-01-15 ENCOUNTER — Institutional Professional Consult (permissible substitution): Payer: Medicare Other | Admitting: Pulmonary Disease

## 2021-01-19 ENCOUNTER — Encounter: Payer: Self-pay | Admitting: *Deleted

## 2021-01-19 ENCOUNTER — Ambulatory Visit (INDEPENDENT_AMBULATORY_CARE_PROVIDER_SITE_OTHER): Payer: Medicare Other | Admitting: Pulmonary Disease

## 2021-01-19 ENCOUNTER — Other Ambulatory Visit: Payer: Self-pay

## 2021-01-19 ENCOUNTER — Encounter: Payer: Self-pay | Admitting: Pulmonary Disease

## 2021-01-19 VITALS — BP 120/70 | HR 57 | Ht 72.0 in | Wt 276.2 lb

## 2021-01-19 DIAGNOSIS — R0683 Snoring: Secondary | ICD-10-CM

## 2021-01-19 NOTE — Patient Instructions (Signed)
Snoring Moderate probability of significant obstructive sleep apnea  We will schedule you for home sleep study Update your results as soon as reviewed  Treatment options as we discussed  Tentative follow-up in 3 to 4 months Sleep Apnea Sleep apnea affects breathing during sleep. It causes breathing to stop for a short time or to become shallow. It can also increase the risk of:  Heart attack.  Stroke.  Being very overweight (obese).  Diabetes.  Heart failure.  Irregular heartbeat. The goal of treatment is to help you breathe normally again. What are the causes? There are three kinds of sleep apnea:  Obstructive sleep apnea. This is caused by a blocked or collapsed airway.  Central sleep apnea. This happens when the brain does not send the right signals to the muscles that control breathing.  Mixed sleep apnea. This is a combination of obstructive and central sleep apnea. The most common cause of this condition is a collapsed or blocked airway. This can happen if:  Your throat muscles are too relaxed.  Your tongue and tonsils are too large.  You are overweight.  Your airway is too small.   What increases the risk?  Being overweight.  Smoking.  Having a small airway.  Being older.  Being male.  Drinking alcohol.  Taking medicines to calm yourself (sedatives or tranquilizers).  Having family members with the condition. What are the signs or symptoms?  Trouble staying asleep.  Being sleepy or tired during the day.  Getting angry a lot.  Loud snoring.  Headaches in the morning.  Not being able to focus your mind (concentrate).  Forgetting things.  Less interest in sex.  Mood swings.  Personality changes.  Feelings of sadness (depression).  Waking up a lot during the night to pee (urinate).  Dry mouth.  Sore throat. How is this diagnosed?  Your medical history.  A physical exam.  A test that is done when you are sleeping (sleep  study). The test is most often done in a sleep lab but may also be done at home. How is this treated?  Sleeping on your side.  Using a medicine to get rid of mucus in your nose (decongestant).  Avoiding the use of alcohol, medicines to help you relax, or certain pain medicines (narcotics).  Losing weight, if needed.  Changing your diet.  Not smoking.  Using a machine to open your airway while you sleep, such as: ? An oral appliance. This is a mouthpiece that shifts your lower jaw forward. ? A CPAP device. This device blows air through a mask when you breathe out (exhale). ? An EPAP device. This has valves that you put in each nostril. ? A BPAP device. This device blows air through a mask when you breathe in (inhale) and breathe out.  Having surgery if other treatments do not work. It is important to get treatment for sleep apnea. Without treatment, it can lead to:  High blood pressure.  Coronary artery disease.  In men, not being able to have an erection (impotence).  Reduced thinking ability.   Follow these instructions at home: Lifestyle  Make changes that your doctor recommends.  Eat a healthy diet.  Lose weight if needed.  Avoid alcohol, medicines to help you relax, and some pain medicines.  Do not use any products that contain nicotine or tobacco, such as cigarettes, e-cigarettes, and chewing tobacco. If you need help quitting, ask your doctor. General instructions  Take over-the-counter and prescription medicines only as  told by your doctor.  If you were given a machine to use while you sleep, use it only as told by your doctor.  If you are having surgery, make sure to tell your doctor you have sleep apnea. You may need to bring your device with you.  Keep all follow-up visits as told by your doctor. This is important. Contact a doctor if:  The machine that you were given to use during sleep bothers you or does not seem to be working.  You do not get  better.  You get worse. Get help right away if:  Your chest hurts.  You have trouble breathing in enough air.  You have an uncomfortable feeling in your back, arms, or stomach.  You have trouble talking.  One side of your body feels weak.  A part of your face is hanging down. These symptoms may be an emergency. Do not wait to see if the symptoms will go away. Get medical help right away. Call your local emergency services (911 in the [place] Do not drive yourself to the hospital. Summary  This condition affects breathing during sleep.  The most common cause is a collapsed or blocked airway.  The goal of treatment is to help you breathe normally while you sleep. This information is not intended to replace advice given to you by your health care provider. Make sure you discuss any questions you have with your health care provider. Document Revised: 09/11/2018 Document Reviewed: 07/21/2018 Elsevier Patient Education  [place].

## 2021-01-19 NOTE — Progress Notes (Signed)
[name]    [id number]    1951/04/03  Primary Care Physician:Plotnikov, Evie Lacks, MD  Referring Physician: Lelon Perla, [place],  [place] [address]  Chief complaint:   Patient being seen for snoring  HPI:  Longstanding history of snoring No witnessed apneas  Usually goes to bed between 8 and 9 PM Falls asleep soon after 2-3 awakenings Final wake up time about 6 AM Weight has been stable over the years  Reformed smoker Denies any significant headache in the morning Occasional dryness of his mouth in the mornings No night sweats Both parents snored He feels rested when he wakes up in the morning Memory is good Focus is good   Outpatient Encounter Medications as of 01/19/2021  Medication Sig  . aspirin 81 MG tablet Take 81 mg by mouth daily.  . hydrochlorothiazide (MICROZIDE) 12.5 MG capsule TAKE 1 CAPSULE BY MOUTH EVERY DAY  . lisinopril (ZESTRIL) 40 MG tablet TAKE 1 TABLET BY MOUTH EVERY DAY  . omeprazole (PRILOSEC) 20 MG capsule TAKE 1 CAPSULE DAILY. PLEASE SCHEDULE A YEARLY OFFICE VISIT FOR ANY FURTHER REFILLS. THANK YOU  . rosuvastatin (CRESTOR) 20 MG tablet Take 20 mg by mouth daily.  . rosuvastatin (CRESTOR) 20 MG tablet Take 1 tablet (20 mg total) by mouth daily.   No facility-administered encounter medications on file as of 01/19/2021.    Allergies as of 01/19/2021 - Review Complete 01/19/2021  Allergen Reaction Noted  . Niacin Other (See Comments) 04/10/2009    Past Medical History:  Diagnosis Date  . Benign neoplasm of colon   . Esophageal reflux   . Esophagitis, unspecified   . Hemorrhoids   . Hypertension   . Osteoarthrosis, unspecified whether generalized or localized, unspecified site   . Other and unspecified hyperlipidemia   . Skin cancer of nose    malignant skin cancer  . Unspecified essential hypertension     Past Surgical History:  Procedure Laterality Date  . COLONOSCOPY     . ENDOSCOPIC LUMBAR DISCECTOMY W/ LASER    . KNEE SURGERY     Bilateral , REPLACED  . microdisctomy  2002   Lumbar spine  . Open knee cartilage repair    . right total knee arthroplasty    . skin cancer removal    . WISDOM TOOTH EXTRACTION  1975    Family History  Problem Relation Age of Onset  . Hypertension Mother   . Colon cancer Mother   . Diabetes Mother   . Heart failure Father   . Colon cancer Paternal Grandfather     Social History   Socioeconomic History  . Marital status: Single    Spouse name: Not on file  . Number of children: Not on file  . Years of education: Not on file  . Highest education level: Not on file  Occupational History  . Occupation: Employed    Comment: Self Employed--Financial   Tobacco Use  . Smoking status: Former Research scientist (life sciences)  . Smokeless tobacco: Never Used  Substance and Sexual Activity  . Alcohol use: Yes    Comment: OCCASIONALLY  . Drug use: No  . Sexual activity: Not on file  Other Topics Concern  . Not on file  Social History Narrative  . Not on file   Social Determinants of Health   Financial Resource Strain: Not on file  Food Insecurity: Not on file  Transportation Needs: Not on file  Physical  Activity: Not on file  Stress: Not on file  Social Connections: Not on file  Intimate Partner Violence: Not on file    Review of Systems  Constitutional: Negative for fatigue.  Psychiatric/Behavioral: Positive for sleep disturbance.    There were no vitals filed for this visit.   Physical Exam Constitutional:      Appearance: He is obese.  HENT:     Mouth/Throat:     Mouth: Mucous membranes are moist.     Comments: Crowded oropharynx, Mallampati 4 Neck:     Comments: Neck is thick Cardiovascular:     Rate and Rhythm: Normal rate and regular rhythm.     Heart sounds: No murmur heard. No friction rub.  Pulmonary:     Effort: No respiratory distress.     Breath sounds: No stridor. No wheezing or rhonchi.   Musculoskeletal:     Cervical back: No rigidity or tenderness.  Neurological:     Mental Status: He is alert.     Results of the Epworth flowsheet 01/19/2021  Sitting and reading 1  Watching TV 1  Sitting, inactive in a public place (e.g. a theatre or a meeting) 1  As a passenger in a car for an hour without a break 1  Lying down to rest in the afternoon when circumstances permit 1  Sitting and talking to someone 0  Sitting quietly after a lunch without alcohol 1  In a car, while stopped for a few minutes in traffic 0  Total score 6    Assessment:  Significant snoring  Moderate probability of significant obstructive sleep apnea  Pathophysiology of sleep disordered breathing discussed with the patient Treatment options for sleep disordered breathing discussed with the patient  Plan/Recommendations: We will schedule the patient for home sleep study  Encourage weight loss efforts  Tentative follow-up in 3 to 4 months  Encouraged to call with any significant concerns   Sherrilyn Rist MD [facility] 01/19/2021, 9:27 AM  CC: Lelon Perla, MD

## 2021-01-31 ENCOUNTER — Ambulatory Visit: Payer: Medicare Other

## 2021-01-31 ENCOUNTER — Other Ambulatory Visit: Payer: Self-pay

## 2021-01-31 DIAGNOSIS — G4733 Obstructive sleep apnea (adult) (pediatric): Secondary | ICD-10-CM | POA: Diagnosis not present

## 2021-01-31 DIAGNOSIS — R0683 Snoring: Secondary | ICD-10-CM

## 2021-02-05 DIAGNOSIS — G4733 Obstructive sleep apnea (adult) (pediatric): Secondary | ICD-10-CM | POA: Diagnosis not present

## 2021-02-06 ENCOUNTER — Telehealth: Payer: Self-pay | Admitting: Pulmonary Disease

## 2021-02-06 NOTE — Telephone Encounter (Signed)
Call patient  Sleep study result  Date of study: 01/31/2021  Impression: Moderate obstructive sleep apnea Mild oxygen desaturations  Recommendation: DME referral  Recommend CPAP therapy for moderate obstructive sleep apnea  Auto titrating CPAP with pressure settings of 5-15 will be appropriate  Encourage weight loss measures  Follow-up in the office 4 to 6 weeks following initiation of treatment

## 2021-02-07 NOTE — Telephone Encounter (Signed)
ATC patient to go over sleep study results, per DPR left detailed message with results and advised patient to call office back so this can be discussed further and order can be placed for CPAP if he wants to proceed. Will wait to hear from patient

## 2021-02-12 NOTE — Telephone Encounter (Signed)
ATC patient X2 to go over sleep study results per DPR left detailed message

## 2021-02-14 NOTE — Telephone Encounter (Signed)
Will send a letter to have the patient call for their results  Then will close message per office protocol.

## 2021-04-19 ENCOUNTER — Other Ambulatory Visit: Payer: Self-pay | Admitting: Cardiology

## 2021-07-16 ENCOUNTER — Other Ambulatory Visit: Payer: Self-pay | Admitting: Cardiology

## 2021-09-10 ENCOUNTER — Telehealth: Payer: Self-pay | Admitting: Gastroenterology

## 2021-09-10 MED ORDER — OMEPRAZOLE 20 MG PO CPDR: 20.0000 mg | DELAYED_RELEASE_CAPSULE | Freq: Every day | ORAL | 1 refills | Status: DC

## 2021-09-10 NOTE — Telephone Encounter (Signed)
Inbound call from patient requesting medication refill for omeprazole 20mg  sent to [facility] on [address].

## 2021-09-10 NOTE — Telephone Encounter (Signed)
Patient has an appointment in November.  Refill sent

## 2021-10-04 ENCOUNTER — Other Ambulatory Visit: Payer: Self-pay | Admitting: Gastroenterology

## 2021-10-22 ENCOUNTER — Encounter: Payer: Self-pay | Admitting: Gastroenterology

## 2021-10-22 ENCOUNTER — Ambulatory Visit: Payer: Medicare Other | Admitting: Gastroenterology

## 2021-10-22 VITALS — BP 146/68 | HR 72 | Ht 72.0 in | Wt 270.0 lb

## 2021-10-22 DIAGNOSIS — Z79899 Other long term (current) drug therapy: Secondary | ICD-10-CM | POA: Diagnosis not present

## 2021-10-22 DIAGNOSIS — Z800 Family history of malignant neoplasm of digestive organs: Secondary | ICD-10-CM

## 2021-10-22 DIAGNOSIS — K219 Gastro-esophageal reflux disease without esophagitis: Secondary | ICD-10-CM

## 2021-10-22 DIAGNOSIS — Z791 Long term (current) use of non-steroidal anti-inflammatories (NSAID): Secondary | ICD-10-CM

## 2021-10-22 DIAGNOSIS — K760 Fatty (change of) liver, not elsewhere classified: Secondary | ICD-10-CM

## 2021-10-22 DIAGNOSIS — Z86010 Personal history of colonic polyps: Secondary | ICD-10-CM

## 2021-10-22 MED ORDER — OMEPRAZOLE 20 MG PO CPDR: 20.0000 mg | DELAYED_RELEASE_CAPSULE | Freq: Every day | ORAL | 3 refills | Status: DC

## 2021-10-22 NOTE — Progress Notes (Signed)
HPI :  70 year old male with a history of GERD, history of polyps and family history of colon cancer, history of fatty liver, here for follow-up visit to discuss these issues.  He was last seen in April 2020.  He has had a history of GERD for some time.  Main symptoms are pyrosis.  He has been on omeprazole 20 mg once daily and that works really well for him.  About 1 day/week he will take it twice a day for some breakthrough which he states is usually due to dietary indiscretion.  Most of the time he feels well without any breakthrough.  He denies any dysphagia.  He had an EGD in 2006 which showed no evidence of Barrett's.  He denies any nausea or vomiting.  No abdominal pains.  His last colonoscopy was performed and June 2018.  At that point time he had 2 adenomas removed.  His mother had colon cancer diagnosed at age 39s.  He also endorses a paternal grandfather had colon cancer diagnosed at some point.  He denies any blood in the stools.  Bowel habits are regular.  He has a history of fatty liver noted on imaging.  He denies any routine alcohol use.  He weighs about the same as when I last saw him a few years ago, weight is typically around 270 lbs, body mass index at 36.6 today.  He has had a hard time losing weight.  LFTs historically have looked okay but have not been checked in a few years.  He is overdue for that and cholesterol testing per his cardiologist.  He has no known history of cirrhosis.  He otherwise does endorse joint pains for which he takes 4 Motrin's every night as well as Tylenol arthritis.  He states he will take this pretty much every night.  We discussed long-term applications of that.  Prior work-up: Colonoscopy 06/06/2017 - 4 small polyps - 2 adenomas, diverticulosis in the left colon - recall at 4 years Colonoscopy 2012 - normal EGD 2006 - normal   Nuclear stress test 06/09/21 - The left ventricular ejection fraction is normal (55-65%). Nuclear stress EF: 56%.  Inferoseptal and inferior hypokinesis. There was no ST segment deviation noted during stress. This is a low risk study   Past Medical History:  Diagnosis Date   Benign neoplasm of colon    Esophageal reflux    Esophagitis, unspecified    Hemorrhoids    Hypertension    Osteoarthrosis, unspecified whether generalized or localized, unspecified site    Other and unspecified hyperlipidemia    Skin cancer of nose    malignant skin cancer   Unspecified essential hypertension      Past Surgical History:  Procedure Laterality Date   COLONOSCOPY     ENDOSCOPIC LUMBAR DISCECTOMY W/ LASER     KNEE SURGERY     Bilateral , REPLACED   microdisctomy  2002   Lumbar spine   Open knee cartilage repair     right total knee arthroplasty     skin cancer removal     WISDOM TOOTH EXTRACTION  1975   Family History  Problem Relation Age of Onset   Hypertension Mother    Colon cancer Mother    Diabetes Mother    Heart failure Father    Colon cancer Paternal Grandfather    Social History   Tobacco Use   Smoking status: Former    Packs/day: 2.50    Years: 30.00    Pack years:  75.00    Types: Cigarettes    Quit date: 02/11/2001    Years since quitting: 20.7   Smokeless tobacco: Never  Substance Use Topics   Alcohol use: Yes    Comment: OCCASIONALLY   Drug use: No   Current Outpatient Medications  Medication Sig Dispense Refill   aspirin 81 MG tablet Take 81 mg by mouth daily.     hydrochlorothiazide (MICROZIDE) 12.5 MG capsule TAKE 1 CAPSULE BY MOUTH EVERY DAY 90 capsule 1   lisinopril (ZESTRIL) 40 MG tablet TAKE 1 TABLET BY MOUTH EVERY DAY 90 tablet 2   omeprazole (PRILOSEC) 20 MG capsule TAKE 1 CAPSULE BY MOUTH DAILY. PLEASE KEEP NOV APPT FOR FURTHER REFILLS 30 capsule 0   No current facility-administered medications for this visit.   Allergies  Allergen Reactions   Niacin Other (See Comments)    REACTION: flushing REACTION: flushing     Review of Systems: All systems  reviewed and negative except where noted in HPI.   Lab Results  Component Value Date   ALT 37 07/23/2017   AST 26 07/23/2017   ALKPHOS 89 07/23/2017   BILITOT 0.4 07/23/2017     Physical Exam: BP (!) 146/68   Pulse 72   Ht 6' (1.829 m)   Wt 270 lb (122.5 kg)   BMI 36.62 kg/m  Constitutional: Pleasant,well-developed, male in no acute distress. Neurological: Alert and oriented to person place and time. Psychiatric: Normal mood and affect. Behavior is normal.   ASSESSMENT AND PLAN: 70 year old male here for reassessment following:  GERD Long-term use of PPI History of colon polyps History of colon cancer Fatty liver Long-term NSAID use  We discussed these issues as above.  Reflux typically pretty well controlled with low-dose omeprazole.  Prior EGD showed no evidence of Barrett's.  We discussed long-term risks benefits of chronic PPI use.  He is occasionally using twice daily dosing to control symptoms, I do not think he would tolerate stopping the regimen.  He understands long-term risks but he is tolerating it okay it works well for him.  He will continue omeprazole at this time.  He is due for a colonoscopy in June of next year in light of his family history of colon cancer.  Otherwise asymptomatic without bowel symptoms at this time.  We reviewed his history of fatty liver, risks for fibrotic change and cirrhosis long-term.  He has struggled to lose weight but understands that his main way to treat this and prevent complications.  He will continue to work on weight loss.  He is due for LFTs and we will order this for him, also will check lipid panel as he is overdue for that as well.  We otherwise discussed his NSAID use which is significant on a daily basis.  With long-term NSAID use I outlined increased risks for PUD, renal disease, cardiovascular complications, etc. Recommend he stop routine use and use as needed moving forward.  He can use Tylenol at low-dose routinely for his  pains which is much safer for his GI tract.  Plan: - continue omeprazole, discussed risks / benefits of chronic PPI use - repeat colonoscopy in June for FH of CRC - LFTs, lipid panel - come back to get drawn when fasting - counseled on fatty liver, will work on weight loss - reduce NSAID use, he should see PCP about joint pains and discuss other options.  Jolly Mango, MD [facility]

## 2021-10-22 NOTE — Patient Instructions (Addendum)
If you are age 71 or older, your body mass index should be between 23-30. Your Body mass index is 36.62 kg/m. If this is out of the aforementioned range listed, please consider follow up with your Primary Care Provider.  If you are age 73 or younger, your body mass index should be between 19-25. Your Body mass index is 36.62 kg/m. If this is out of the aformentioned range listed, please consider follow up with your Primary Care Provider.   ________________________________________________________  The [facility] providers would like to encourage you to use [facility] to communicate with providers for non-urgent requests or questions.  Due to long hold times on the telephone, sending your provider a message by [facility] may be a faster and more efficient way to get a response.  Please allow 48 business hours for a response.  Please remember that this is for non-urgent requests.  _______________________________________________________  Please go to the lab in the basement of our building to have FASTING lab work done . Hit "B" for basement when you get on the elevator.  When the doors open the lab is on your left.  We will call you with the results. Thank you.  We have sent the following medications to your pharmacy for you to pick up at your convenience: Omeprazole   Minimize NSAID use.  Please see your PCP for follow up.  You will be due for a recall colonoscopy in 05-2022. We will send you a reminder in the mail when it gets closer to that time.  Thank you for entrusting me with your care and for choosing [facility], Dr. Allgood Cellar

## 2021-12-19 DIAGNOSIS — M545 Low back pain, unspecified: Secondary | ICD-10-CM | POA: Diagnosis not present

## 2021-12-28 DIAGNOSIS — M545 Low back pain, unspecified: Secondary | ICD-10-CM | POA: Diagnosis not present

## 2022-01-06 ENCOUNTER — Other Ambulatory Visit: Payer: Self-pay | Admitting: Cardiology

## 2022-03-05 IMAGING — CT CT SHOULDER*R* W/O CM
1 series · 12 of 14 positions shown, 15 images · non-contrast
Comparison: None.

CLINICAL DATA: Preop shoulder replacement

EXAM:
CT OF THE UPPER RIGHT EXTREMITY WITHOUT CONTRAST
TECHNIQUE: Multidetector CT imaging of the upper right extremity was performed
according to the standard protocol.

[Series 4: shoulder 2.00 br40 s3 axial soft · axial · 0.52mm/px · z∈[-914,-716]mm · 12 of 117 slices shown, 15 images]
[im 9/117  soft-tissue]
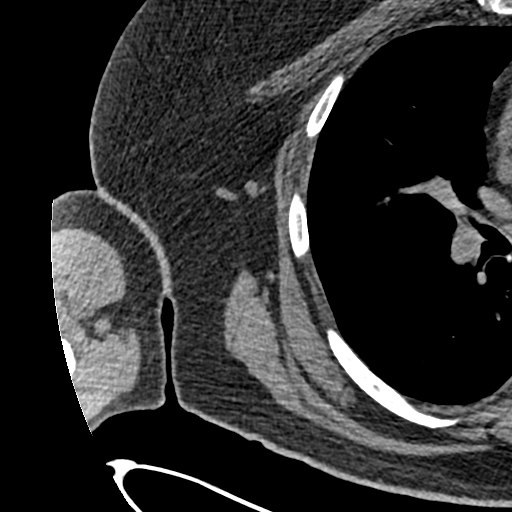
[im 9/117  bone]
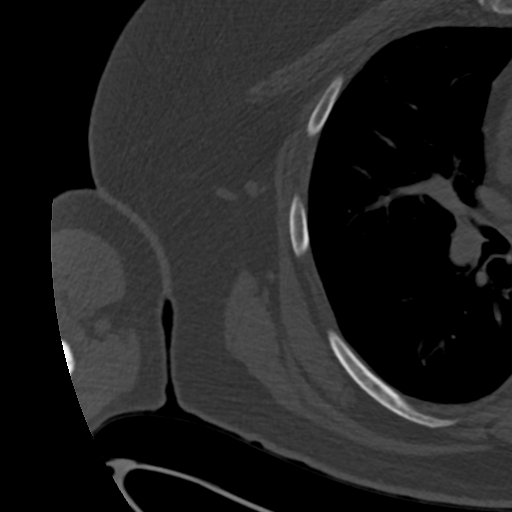
[im 18/117  bone]
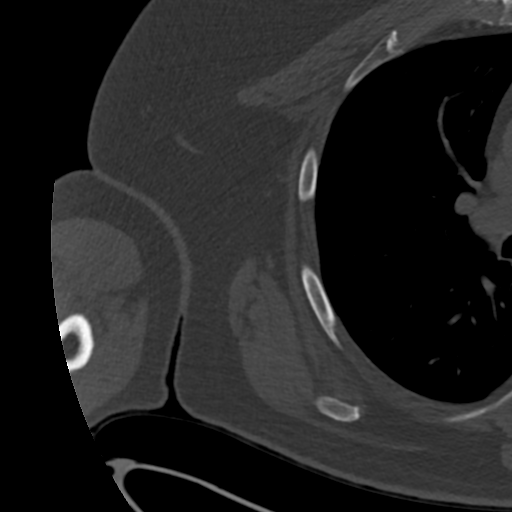
[im 27/117  bone]
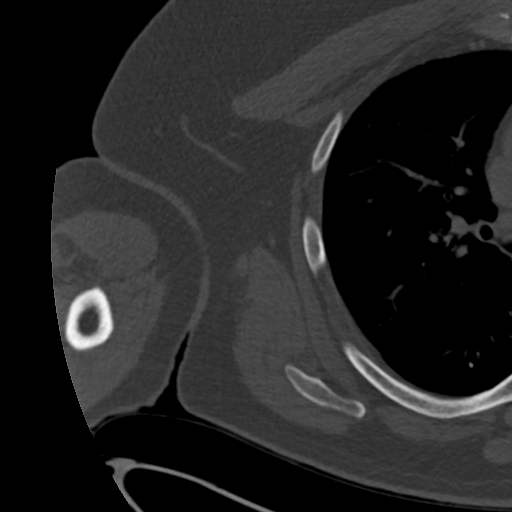
[im 36/117  bone]
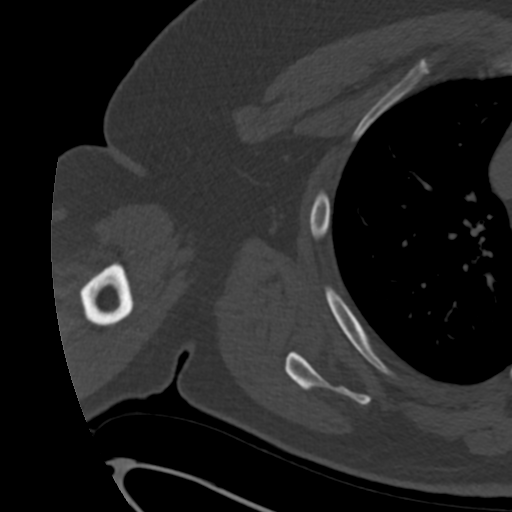
[im 45/117  soft-tissue]
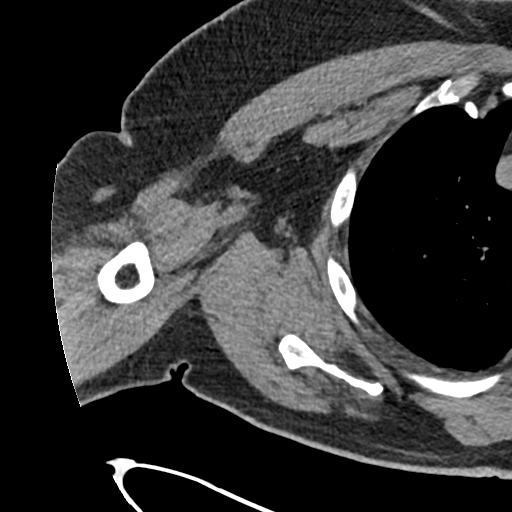
[im 45/117  bone]
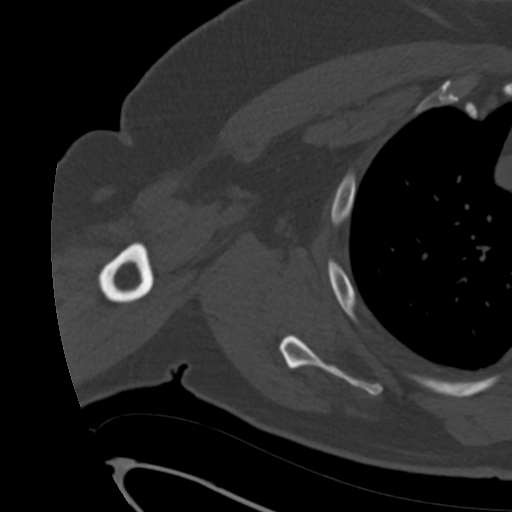
[im 54/117  bone]
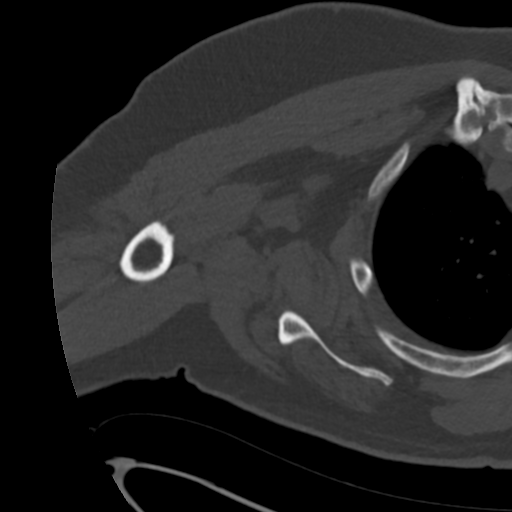
[im 63/117  bone]
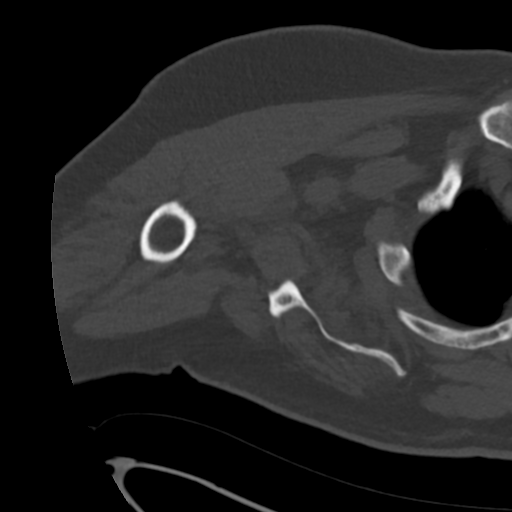
[im 72/117  bone]
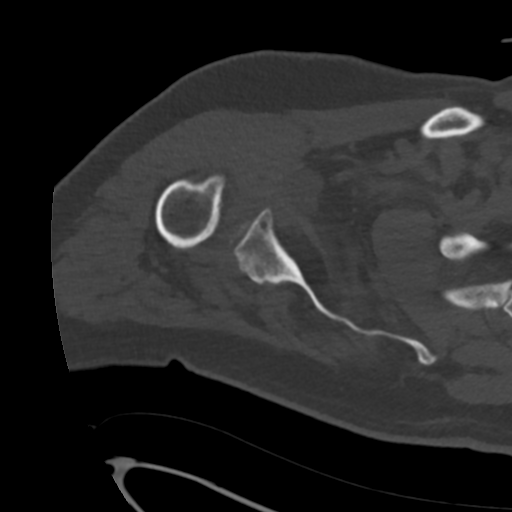
[im 81/117  soft-tissue]
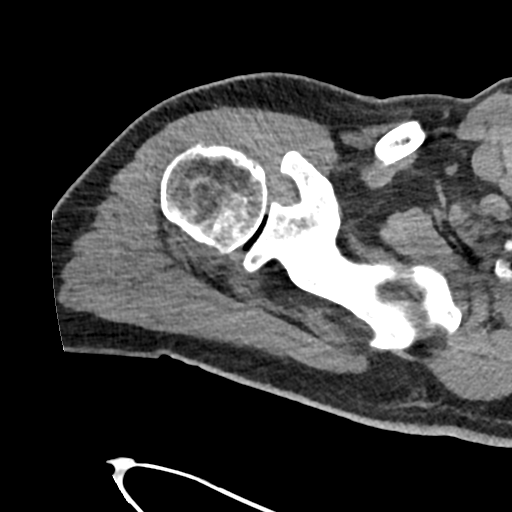
[im 81/117  bone]
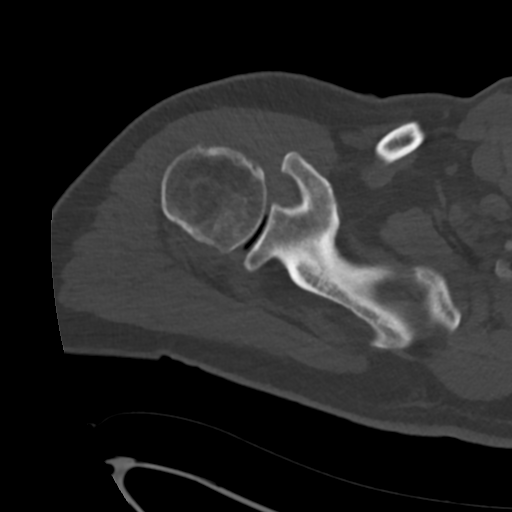
[im 90/117  bone]
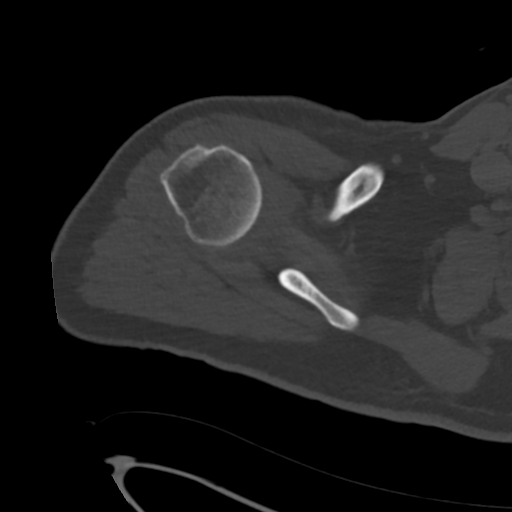
[im 99/117  bone]
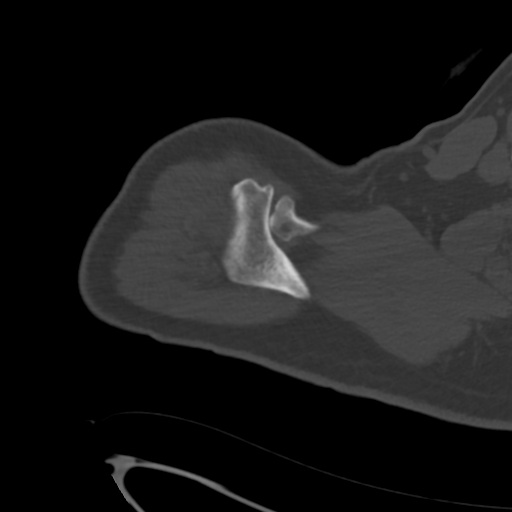
[im 108/117  bone]
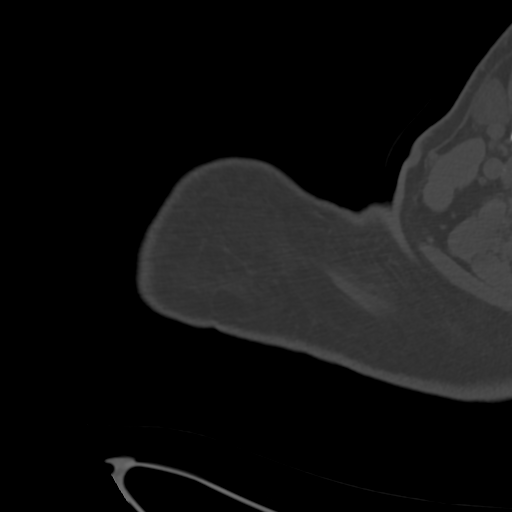

[12 of 14 positions shown; findings below may reference images not displayed]

FINDINGS: Bones/Joint/Cartilage

No fracture or dislocation. Normal alignment. No joint effusion.

Moderate osteoarthritis of the glenohumeral joint with joint space
narrowing and marginal osteophytosis. Small subchondral cyst in the
anterior inferior glenoid measuring 5 mm.

Narrowing of the acromiohumeral distance suggesting a supraspinatus
tear.

Mild arthropathy of the acromioclavicular joint.  Type I acromion.

Ligaments

Ligaments are suboptimally evaluated by CT.

Muscles and Tendons
Mild atrophy of the supraspinatus, infraspinatus and subscapularis
muscles.

Soft tissue
No fluid collection or hematoma.  No soft tissue mass.
IMPRESSION: 1. Moderate osteoarthritis of the glenohumeral joint.
2. Narrowing of the acromiohumeral distance suggesting a
supraspinatus tear.
3. Mild atrophy of the supraspinatus, infraspinatus and
subscapularis muscles.

## 2022-03-29 ENCOUNTER — Encounter: Payer: Self-pay | Admitting: Cardiology

## 2022-03-29 ENCOUNTER — Other Ambulatory Visit: Payer: Self-pay

## 2022-03-29 ENCOUNTER — Telehealth: Payer: Self-pay | Admitting: Cardiology

## 2022-03-29 MED ORDER — HYDROCHLOROTHIAZIDE 12.5 MG PO CAPS: 12.5000 mg | ORAL_CAPSULE | Freq: Every day | ORAL | 0 refills | Status: DC

## 2022-03-29 NOTE — Telephone Encounter (Signed)
?*  STAT* If patient is at the pharmacy, call can be transferred to refill team. ? ? ?1. Which medications need to be refilled? (please list name of each medication and dose if known) need a new prescription for Hydrochlorothiazide ? ?2. Which pharmacy/location (including street and city if local pharmacy) is medication to be sent to?[facility] [address] [place] ? ?3. Do they need a 30 day or 90 day supply? 90 days  and refills ? ?

## 2022-04-01 ENCOUNTER — Other Ambulatory Visit: Payer: Self-pay

## 2022-04-01 MED ORDER — HYDROCHLOROTHIAZIDE 12.5 MG PO CAPS: 12.5000 mg | ORAL_CAPSULE | Freq: Every day | ORAL | 0 refills | Status: DC

## 2022-04-01 NOTE — Telephone Encounter (Signed)
Called patient to advise medication refill sent to pharmacy. ?

## 2022-04-30 NOTE — Progress Notes (Signed)
Fu hypertension and hyperlipidemia. Echo 6/16 showed normal LV function, mild LVH/LVE and grade 1 diastolic dysfunction. Abd ultrasound 6/16 showed no aneurysm. Ca score 11/20 587. Nuclear study 7/21 showed EF 56 and no ischemia. Since last seen, patient denies dyspnea, chest pain, palpitations or syncope.  Current Outpatient Medications  Medication Sig Dispense Refill   amoxicillin (AMOXIL) 875 MG tablet Take 875 mg by mouth 2 (two) times daily. Dental work     aspirin 81 MG tablet Take 81 mg by mouth daily.     hydrochlorothiazide (MICROZIDE) 12.5 MG capsule Take 1 capsule (12.5 mg total) by mouth daily. 90 capsule 0   lisinopril (ZESTRIL) 40 MG tablet TAKE 1 TABLET BY MOUTH EVERY DAY 90 tablet 2   omeprazole (PRILOSEC) 20 MG capsule Take 1 capsule (20 mg total) by mouth daily. 90 capsule 3   No current facility-administered medications for this visit.     Past Medical History:  Diagnosis Date   Benign neoplasm of colon    Esophageal reflux    Esophagitis, unspecified    Hemorrhoids    Hypertension    Osteoarthrosis, unspecified whether generalized or localized, unspecified site    Other and unspecified hyperlipidemia    Skin cancer of nose    malignant skin cancer   Unspecified essential hypertension     Past Surgical History:  Procedure Laterality Date   COLONOSCOPY     ENDOSCOPIC LUMBAR DISCECTOMY W/ LASER     KNEE SURGERY     Bilateral , REPLACED   microdisctomy  2002   Lumbar spine   Open knee cartilage repair     right total knee arthroplasty     skin cancer removal     WISDOM TOOTH EXTRACTION  1975    Social History   Socioeconomic History   Marital status: Single    Spouse name: Not on file   Number of children: Not on file   Years of education: Not on file   Highest education level: Not on file  Occupational History   Occupation: Employed    Comment: Self Employed--Financial   Tobacco Use   Smoking status: Former    Packs/day: 2.50    Years:  30.00    Pack years: 75.00    Types: Cigarettes    Quit date: 02/11/2001    Years since quitting: 21.2   Smokeless tobacco: Never  Substance and Sexual Activity   Alcohol use: Yes    Comment: OCCASIONALLY   Drug use: No   Sexual activity: Not on file  Other Topics Concern   Not on file  Social History Narrative   Not on file   Social Determinants of Health   Financial Resource Strain: Not on file  Food Insecurity: Not on file  Transportation Needs: Not on file  Physical Activity: Not on file  Stress: Not on file  Social Connections: Not on file  Intimate Partner Violence: Not on file    Family History  Problem Relation Age of Onset   Hypertension Mother    Colon cancer Mother    Diabetes Mother    Heart failure Father    Colon cancer Paternal Grandfather     ROS: no fevers or chills, productive cough, hemoptysis, dysphasia, odynophagia, melena, hematochezia, dysuria, hematuria, rash, seizure activity, orthopnea, PND, pedal edema, claudication. Remaining systems are negative.  Physical Exam: Well-developed well-nourished in no acute distress.  Skin is warm and dry.  HEENT is normal.  Neck is supple.  Chest is  clear to auscultation with normal expansion.  Cardiovascular exam is regular rate and rhythm.  Abdominal exam nontender or distended. No masses palpated. Extremities show no edema. neuro grossly intact  ECG-sinus bradycardia at a rate of 50, nonspecific lateral T wave changes.  Personally reviewed  A/P  1 coronary artery disease-this is based on previously elevated calcium score.  Follow-up functional study showed no ischemia.  Continue aspirin and add statin.  Note he is not having chest pain.  2 hypertension-blood pressure controlled.  Continue present medical regimen.  Check potassium and renal function.  3 hyperlipidemia-given documented coronary disease we will add Crestor 40 mg daily.  Check lipids and liver in 8 weeks.  4 obesity-we discussed  importance of diet, exercise and weight loss.  [name], MD

## 2022-05-08 ENCOUNTER — Ambulatory Visit: Payer: Medicare Other | Admitting: Cardiology

## 2022-05-08 ENCOUNTER — Encounter: Payer: Self-pay | Admitting: Cardiology

## 2022-05-08 VITALS — BP 108/76 | HR 50 | Ht 72.0 in | Wt 267.0 lb

## 2022-05-08 DIAGNOSIS — I10 Essential (primary) hypertension: Secondary | ICD-10-CM

## 2022-05-08 DIAGNOSIS — I2510 Atherosclerotic heart disease of native coronary artery without angina pectoris: Secondary | ICD-10-CM | POA: Diagnosis not present

## 2022-05-08 DIAGNOSIS — E7800 Pure hypercholesterolemia, unspecified: Secondary | ICD-10-CM

## 2022-05-08 MED ORDER — ROSUVASTATIN CALCIUM 40 MG PO TABS: 40.0000 mg | ORAL_TABLET | Freq: Every day | ORAL | 3 refills | Status: DC

## 2022-05-08 NOTE — Patient Instructions (Signed)
Medication Instructions:   START ROSUVASTATIN 40 MG ONCE DAILY  *If you need a refill on your cardiac medications before your next appointment, please call your pharmacy*   Lab Work:  Your physician recommends that you return for lab work in: [place]  If you have labs (blood work) drawn today and your tests are completely normal, you will receive your results only by: [place] (if you have MyChart) OR A paper copy in the mail If you have any lab test that is abnormal or we need to change your treatment, we will call you to review the results.   Follow-Up: At [facility], you and your health needs are our priority.  As part of our continuing mission to provide you with exceptional heart care, we have created designated Provider Care Teams.  These Care Teams include your primary Cardiologist (physician) and Advanced Practice Providers (APPs -  Physician Assistants and Nurse Practitioners) who all work together to provide you with the care you need, when you need it.  We recommend signing up for the patient portal called "MyChart".  Sign up information is provided on this After Visit Summary.  MyChart is used to connect with patients for Virtual Visits (Telemedicine).  Patients are able to view lab/test results, encounter notes, upcoming appointments, etc.  Non-urgent messages can be sent to your provider as well.   To learn more about what you can do with MyChart, go to [email or web address].    Your next appointment:   12 month(s)  The format for your next appointment:   In Person  Provider:   [name], MD     Important Information About Sugar

## 2022-06-25 DIAGNOSIS — H9209 Otalgia, unspecified ear: Secondary | ICD-10-CM | POA: Diagnosis not present

## 2022-06-25 DIAGNOSIS — R051 Acute cough: Secondary | ICD-10-CM | POA: Diagnosis not present

## 2022-06-25 DIAGNOSIS — R519 Headache, unspecified: Secondary | ICD-10-CM | POA: Diagnosis not present

## 2022-06-25 DIAGNOSIS — J029 Acute pharyngitis, unspecified: Secondary | ICD-10-CM | POA: Diagnosis not present

## 2022-07-01 ENCOUNTER — Encounter: Payer: Self-pay | Admitting: Gastroenterology

## 2022-08-21 ENCOUNTER — Encounter: Payer: Self-pay | Admitting: *Deleted

## 2022-09-03 ENCOUNTER — Other Ambulatory Visit: Payer: Self-pay | Admitting: Cardiology

## 2022-09-17 DIAGNOSIS — U071 COVID-19: Secondary | ICD-10-CM | POA: Diagnosis not present

## 2022-09-17 DIAGNOSIS — I10 Essential (primary) hypertension: Secondary | ICD-10-CM | POA: Diagnosis not present

## 2022-09-17 DIAGNOSIS — Z20822 Contact with and (suspected) exposure to covid-19: Secondary | ICD-10-CM | POA: Diagnosis not present

## 2022-09-17 DIAGNOSIS — J209 Acute bronchitis, unspecified: Secondary | ICD-10-CM | POA: Diagnosis not present

## 2022-10-14 ENCOUNTER — Other Ambulatory Visit: Payer: Self-pay | Admitting: Gastroenterology

## 2022-11-12 ENCOUNTER — Other Ambulatory Visit: Payer: Self-pay | Admitting: Gastroenterology

## 2022-11-13 ENCOUNTER — Telehealth: Payer: Self-pay | Admitting: Cardiology

## 2022-11-13 MED ORDER — LISINOPRIL 40 MG PO TABS: 40.0000 mg | ORAL_TABLET | Freq: Every day | ORAL | 3 refills | Status: DC

## 2022-11-13 NOTE — Telephone Encounter (Signed)
*  STAT* If patient is at the pharmacy, call can be transferred to refill team.   1. Which medications need to be refilled? (please list name of each medication and dose if known)   lisinopril (ZESTRIL) 40 MG tablet   2. Which pharmacy/location (including street and city if local pharmacy) is medication to be sent to?  [id number] [place], [place] - [address]   3. Do they need a 30 day or 90 day supply? 90 day  Patient stated he is completely out of this medication.

## 2022-12-06 ENCOUNTER — Other Ambulatory Visit: Payer: Self-pay | Admitting: Gastroenterology

## 2022-12-09 HISTORY — PX: LIVER ULTRASOUND: SHX5952

## 2023-03-08 ENCOUNTER — Other Ambulatory Visit: Payer: Self-pay | Admitting: Gastroenterology

## 2023-03-31 DIAGNOSIS — M545 Low back pain, unspecified: Secondary | ICD-10-CM | POA: Diagnosis not present

## 2023-03-31 DIAGNOSIS — M5416 Radiculopathy, lumbar region: Secondary | ICD-10-CM | POA: Diagnosis not present

## 2023-04-21 DIAGNOSIS — L03115 Cellulitis of right lower limb: Secondary | ICD-10-CM | POA: Diagnosis not present

## 2023-04-21 DIAGNOSIS — L209 Atopic dermatitis, unspecified: Secondary | ICD-10-CM | POA: Diagnosis not present

## 2023-05-01 DIAGNOSIS — R051 Acute cough: Secondary | ICD-10-CM | POA: Diagnosis not present

## 2023-05-01 DIAGNOSIS — J069 Acute upper respiratory infection, unspecified: Secondary | ICD-10-CM | POA: Diagnosis not present

## 2023-05-01 DIAGNOSIS — J019 Acute sinusitis, unspecified: Secondary | ICD-10-CM | POA: Diagnosis not present

## 2023-05-12 ENCOUNTER — Other Ambulatory Visit: Payer: Self-pay | Admitting: Gastroenterology

## 2023-05-14 DIAGNOSIS — L03115 Cellulitis of right lower limb: Secondary | ICD-10-CM | POA: Diagnosis not present

## 2023-05-29 ENCOUNTER — Encounter: Payer: Self-pay | Admitting: Cardiology

## 2023-05-30 ENCOUNTER — Other Ambulatory Visit: Payer: Self-pay | Admitting: Cardiology

## 2023-06-04 ENCOUNTER — Other Ambulatory Visit: Payer: Self-pay | Admitting: Gastroenterology

## 2023-06-04 ENCOUNTER — Telehealth: Payer: Self-pay | Admitting: Gastroenterology

## 2023-06-04 MED ORDER — OMEPRAZOLE 20 MG PO CPDR: 20.0000 mg | DELAYED_RELEASE_CAPSULE | Freq: Every day | ORAL | 1 refills | Status: DC

## 2023-06-04 NOTE — Telephone Encounter (Signed)
Inbound call from patient stating he has ran out of omeprazole medication. Has a office visit scheduled for 7/5. Requesting to have refill sent in. Also requesting a call because if his request can be done. Please advise, thank you.

## 2023-06-04 NOTE — Telephone Encounter (Signed)
Refill of omeprazole sent to pharmacy to get to July appt

## 2023-06-13 ENCOUNTER — Ambulatory Visit: Payer: Medicare Other | Admitting: Gastroenterology

## 2023-06-13 ENCOUNTER — Encounter: Payer: Self-pay | Admitting: Gastroenterology

## 2023-06-13 ENCOUNTER — Other Ambulatory Visit (INDEPENDENT_AMBULATORY_CARE_PROVIDER_SITE_OTHER): Payer: Medicare Other

## 2023-06-13 VITALS — BP 130/70 | HR 51 | Ht 72.0 in | Wt 271.0 lb

## 2023-06-13 DIAGNOSIS — K760 Fatty (change of) liver, not elsewhere classified: Secondary | ICD-10-CM | POA: Diagnosis not present

## 2023-06-13 DIAGNOSIS — Z79899 Other long term (current) drug therapy: Secondary | ICD-10-CM | POA: Diagnosis not present

## 2023-06-13 DIAGNOSIS — Z86010 Personal history of colonic polyps: Secondary | ICD-10-CM

## 2023-06-13 DIAGNOSIS — K219 Gastro-esophageal reflux disease without esophagitis: Secondary | ICD-10-CM | POA: Diagnosis not present

## 2023-06-13 DIAGNOSIS — Z6836 Body mass index (BMI) 36.0-36.9, adult: Secondary | ICD-10-CM

## 2023-06-13 DIAGNOSIS — Z800 Family history of malignant neoplasm of digestive organs: Secondary | ICD-10-CM | POA: Diagnosis not present

## 2023-06-13 LAB — CBC WITH DIFFERENTIAL/PLATELET
Basophils Absolute: 0 10*3/uL (ref 0.0–0.1)
Basophils Relative: 0.5 % (ref 0.0–3.0)
Eosinophils Absolute: 0.3 10*3/uL (ref 0.0–0.7)
Eosinophils Relative: 3.5 % (ref 0.0–5.0)
HCT: 45.5 % (ref 39.0–52.0)
Hemoglobin: 15.2 g/dL (ref 13.0–17.0)
Lymphocytes Relative: 30.8 % (ref 12.0–46.0)
Lymphs Abs: 2.4 10*3/uL (ref 0.7–4.0)
MCHC: 33.4 g/dL (ref 30.0–36.0)
MCV: 91.4 fl (ref 78.0–100.0)
Monocytes Absolute: 0.7 10*3/uL (ref 0.1–1.0)
Monocytes Relative: 9.8 % (ref 3.0–12.0)
Neutro Abs: 4.2 10*3/uL (ref 1.4–7.7)
Neutrophils Relative %: 55.4 % (ref 43.0–77.0)
Platelets: 243 10*3/uL (ref 150.0–400.0)
RBC: 4.98 Mil/uL (ref 4.22–5.81)
RDW: 14.3 % (ref 11.5–15.5)
WBC: 7.6 10*3/uL (ref 4.0–10.5)

## 2023-06-13 LAB — COMPREHENSIVE METABOLIC PANEL
ALT: 17 U/L (ref 0–53)
AST: 16 U/L (ref 0–37)
Albumin: 4.2 g/dL (ref 3.5–5.2)
Alkaline Phosphatase: 77 U/L (ref 39–117)
BUN: 21 mg/dL (ref 6–23)
CO2: 29 mEq/L (ref 19–32)
Calcium: 9.8 mg/dL (ref 8.4–10.5)
Chloride: 101 mEq/L (ref 96–112)
Creatinine, Ser: 1.22 mg/dL (ref 0.40–1.50)
GFR: 59.21 mL/min — ABNORMAL LOW (ref >60.00)
Glucose, Bld: 122 mg/dL — ABNORMAL HIGH (ref 70–99)
Potassium: 3.5 mEq/L (ref 3.5–5.1)
Sodium: 139 mEq/L (ref 135–145)
Total Bilirubin: 0.6 mg/dL (ref 0.2–1.2)
Total Protein: 7.4 g/dL (ref 6.0–8.3)

## 2023-06-13 MED ORDER — NA SULFATE-K SULFATE-MG SULF 17.5-3.13-1.6 GM/177ML PO SOLN: ORAL | 0 refills | Status: DC

## 2023-06-13 NOTE — Progress Notes (Signed)
HPI :  72 year old male with a history of GERD, fatty liver, history of colon polyps, family history of colon cancer, here for reassessment.  He was last seen in November 2022.  Recall he has been on omeprazole 20 mg daily for some time.  Prior EGD in 2006 showed no evidence of Barrett's.  His main symptoms are that of pyrosis and omeprazole worked really well for him when he takes it.  He denies any breakthrough on the regimen.  Denies any history of CKD or osteopenia/bone fractures.  Tolerating the medicine well.  He has a history of fatty liver noted on imaging back in 2018.  He denies any alcohol use.  His weight has been around 270 pounds for some time now, BMI at 36.  He endorses having a hard time losing weight.  His LFTs have historically been normal although we had hoped to do them after the last visit and he was not able to get the blood work done.  In fact he has not had any blood work done for some years, he states he needs a new primary care physician and is asking for assistance with establishing care.  He has no known history of cirrhosis.  No family history of liver disease.  He states when he stopped smoking tobacco in 2021 that is when he gained a lot of weight and has been unable to lose it.  He otherwise denies any problems with his bowels.  No blood in his stools.  His mother had colon cancer diagnosed at age 59s.  His last colonoscopy was done in June 2018 with 2 small adenomas removed.  He is overdue for surveillance colonoscopy.  He denies any cardiopulmonary symptoms.  He feels well in general without complaints today.   Prior work-up: Colonoscopy 06/06/2017 - 4 small polyps - 2 adenomas, diverticulosis in the left colon - recall at 4 years Colonoscopy 2012 - normal EGD 2006 - normal   Nuclear stress test 06/09/21 - The left ventricular ejection fraction is normal (55-65%). Nuclear stress EF: 56%. Inferoseptal and inferior hypokinesis. There was no ST segment deviation noted  during stress. This is a low risk study   Past Medical History:  Diagnosis Date   Benign neoplasm of colon    Esophageal reflux    Esophagitis, unspecified    Fatty liver    Hemorrhoids    Hypertension    Osteoarthrosis, unspecified whether generalized or localized, unspecified site    Other and unspecified hyperlipidemia    Skin cancer of nose    malignant skin cancer   Unspecified essential hypertension      Past Surgical History:  Procedure Laterality Date   COLONOSCOPY     ENDOSCOPIC LUMBAR DISCECTOMY W/ LASER     KNEE SURGERY     Bilateral , REPLACED   microdisctomy  2002   Lumbar spine   Open knee cartilage repair     right total knee arthroplasty     skin cancer removal     WISDOM TOOTH EXTRACTION  1975   Family History  Problem Relation Age of Onset   Hypertension Mother    Colon cancer Mother    Diabetes Mother    Heart failure Father    Colon cancer Paternal Grandfather    Social History   Tobacco Use   Smoking status: Former    Packs/day: 2.50    Years: 30.00    Additional pack years: 0.00    Total pack years: 75.00  Types: Cigarettes    Quit date: 02/11/2001    Years since quitting: 22.3   Smokeless tobacco: Never  Substance Use Topics   Alcohol use: Yes    Comment: OCCASIONALLY   Drug use: No   Current Outpatient Medications  Medication Sig Dispense Refill   amoxicillin (AMOXIL) 875 MG tablet Take 875 mg by mouth 2 (two) times daily. Dental work     aspirin 81 MG tablet Take 81 mg by mouth daily.     hydrochlorothiazide (MICROZIDE) 12.5 MG capsule TAKE 1 CAPSULE BY MOUTH EVERY DAY 90 capsule 0   lisinopril (ZESTRIL) 40 MG tablet Take 1 tablet (40 mg total) by mouth daily. 90 tablet 3   omeprazole (PRILOSEC) 20 MG capsule Take 1 capsule (20 mg total) by mouth daily. Please keep your July appointment for further refills 30 capsule 1   rosuvastatin (CRESTOR) 40 MG tablet Take 1 tablet (40 mg total) by mouth daily. 90 tablet 3   No current  facility-administered medications for this visit.   Allergies  Allergen Reactions   Niacin Other (See Comments)    REACTION: flushing REACTION: flushing     Review of Systems: All systems reviewed and negative except where noted in HPI.   Lab Results  Component Value Date   ALT 37 07/23/2017   AST 26 07/23/2017   ALKPHOS 89 07/23/2017   BILITOT 0.4 07/23/2017     Physical Exam: BP 130/70   Pulse (!) 51   Ht 6' (1.829 m)   Wt 271 lb (122.9 kg)   BMI 36.75 kg/m  Constitutional: Pleasant,well-developed, male in no acute distress. Neurological: Alert and oriented to person place and time. Psychiatric: Normal mood and affect. Behavior is normal.   ASSESSMENT: 72 y.o. male here for assessment of the following  1. Fatty liver   2. Class 2 severe obesity due to excess calories with serious comorbidity and body mass index (BMI) of 36.0 to 36.9 in adult (HCC)   3. Family history of colon cancer   4. History of colon polyps   5. Gastroesophageal reflux disease, unspecified whether esophagitis present   6. Long-term current use of proton pump inhibitor therapy    Counseled him on fatty liver, risk for fibrotic changes of cirrhosis over time.  He has no known history of cirrhosis, however has not had routine labs now in several years.  We had hoped to do after the last exam but he was not able to get it done.  He is not drinking any alcohol.  Recommend basic labs today to make sure stable.  He has had this for some time now, I offered him an interval ultrasound with elastography to reassess his parenchyma and assess his risk for fibrotic change.  Following discussion of this he wanted to proceed with it.  Otherwise we discussed his weight for a bit, has had a hard time losing weight after stopping tobacco.  I offered him a referral to the [place] weight loss center and he wants to pursue this, referral placed.  Hopefully with weight loss that would really help his  condition.  He is overdue for surveillance colonoscopy given his family history of colon cancer.  I discussed risks and benefits of colonoscopy and anesthesia with him and he wants to proceed.  Otherwise reviewed his history of reflux with him, omeprazole is working really well for him, takes it daily and has no breakthrough on the regimen.  I discussed long-term risks of chronic PPI use, he  understands and wants to continue it.  Prior EGD done and showed no evidence of Barrett's  PLAN: - counseled on fatty liver - risks for fibrosis / cirrhosis, recommend weight loss - lab today for CBC and CMET - scheduled for [place] with elastography - referral to [facility] weight loss center - scheduled for colonoscopy at the [name] - 7/17 @ 130 PM - continue omeprazole, discussed long term risks / benefits of chronic PPI use - refer to [place] primary care - in need of routine primary care  Harlin Rain, MD [facility]

## 2023-06-13 NOTE — Patient Instructions (Signed)
_______________________________________________________  If your blood pressure at your visit was 140/90 or greater, please contact your primary care physician to follow up on this.  _______________________________________________________  If you are age 73 or older, your body mass index should be between 23-30. Your Body mass index is 36.75 kg/m. If this is out of the aforementioned range listed, please consider follow up with your Primary Care Provider.  If you are age 62 or younger, your body mass index should be between 19-25. Your Body mass index is 36.75 kg/m. If this is out of the aformentioned range listed, please consider follow up with your Primary Care Provider.   ________________________________________________________  The [facility] providers would like to encourage you to use [facility] to communicate with providers for non-urgent requests or questions.  Due to long hold times on the telephone, sending your provider a message by [facility] may be a faster and more efficient way to get a response.  Please allow 48 business hours for a response.  Please remember that this is for non-urgent requests.   Your provider has requested that you go to the basement level for lab work before leaving today. Press "B" on the elevator. The lab is located at the first door on the left as you exit the elevator.   _______________________________________________________   Bonita Quin have been scheduled for a colonoscopy. Please follow written instructions given to you at your visit today.   Please pick up your prep supplies at the pharmacy within the next 1-3 days.  If you use inhalers (even only as needed), please bring them with you on the day of your procedure.  DO NOT TAKE 7 DAYS PRIOR TO TEST- Trulicity (dulaglutide) Ozempic, Wegovy (semaglutide) Mounjaro (tirzepatide) Bydureon Bcise (exanatide extended release)  DO NOT TAKE 1 DAY PRIOR TO YOUR TEST Rybelsus (semaglutide) Adlyxin  (lixisenatide) Victoza (liraglutide) Byetta (exanatide) _________________________________________________________________  You have been scheduled for an abdominal ultrasound at [facility] (1st floor of hospital) on Wednesday July 10 at 9:30 am. Please arrive 30 minutes prior to your appointment for registration. Make certain not to have anything to eat or drink 6 hours prior to your appointment. Should you need to reschedule your appointment, please contact radiology at [phone]. This test typically takes about 30 minutes to perform.  We are referring you to [facility].  They will contact you directly to schedule an appointment.  It may take a week or more before you hear from them.  Please feel free to contact us if you have not heard from them within 2 weeks and we will follow up on the referral.   We are referring you to [facility].  They will contact you directly to schedule an appointment.  It may take a week or more before you hear from them.  Please feel free to contact us if you have not heard from them within 2 weeks and we will follow up on the referral.   Thank you for entrusting me with your care and for choosing [facility], Dr. Ileene Patrick

## 2023-06-18 ENCOUNTER — Ambulatory Visit (HOSPITAL_COMMUNITY)
Admission: RE | Admit: 2023-06-18 | Discharge: 2023-06-18 | Disposition: A | Discharge status: Home / Self Care | Payer: Medicare Other | Source: Ambulatory Visit | Attending: Gastroenterology | Admitting: Gastroenterology

## 2023-06-18 DIAGNOSIS — K760 Fatty (change of) liver, not elsewhere classified: Secondary | ICD-10-CM | POA: Insufficient documentation

## 2023-06-18 DIAGNOSIS — Z800 Family history of malignant neoplasm of digestive organs: Secondary | ICD-10-CM | POA: Diagnosis not present

## 2023-06-18 DIAGNOSIS — Z86010 Personal history of colonic polyps: Secondary | ICD-10-CM | POA: Insufficient documentation

## 2023-06-18 DIAGNOSIS — Z6836 Body mass index (BMI) 36.0-36.9, adult: Secondary | ICD-10-CM | POA: Insufficient documentation

## 2023-06-18 DIAGNOSIS — K219 Gastro-esophageal reflux disease without esophagitis: Secondary | ICD-10-CM | POA: Diagnosis not present

## 2023-06-18 DIAGNOSIS — Z79899 Other long term (current) drug therapy: Secondary | ICD-10-CM | POA: Diagnosis not present

## 2023-06-24 ENCOUNTER — Encounter: Payer: Self-pay | Admitting: Certified Registered Nurse Anesthetist

## 2023-06-25 ENCOUNTER — Ambulatory Visit (AMBULATORY_SURGERY_CENTER): Payer: Medicare Other | Admitting: Gastroenterology

## 2023-06-25 ENCOUNTER — Encounter: Payer: Self-pay | Admitting: Gastroenterology

## 2023-06-25 VITALS — BP 139/87 | HR 57 | Temp 97.3°F | Resp 12 | Ht 72.0 in | Wt 271.0 lb

## 2023-06-25 DIAGNOSIS — Z86010 Personal history of colonic polyps: Secondary | ICD-10-CM

## 2023-06-25 DIAGNOSIS — K635 Polyp of colon: Secondary | ICD-10-CM

## 2023-06-25 DIAGNOSIS — K219 Gastro-esophageal reflux disease without esophagitis: Secondary | ICD-10-CM | POA: Diagnosis not present

## 2023-06-25 DIAGNOSIS — D127 Benign neoplasm of rectosigmoid junction: Secondary | ICD-10-CM

## 2023-06-25 DIAGNOSIS — Z09 Encounter for follow-up examination after completed treatment for conditions other than malignant neoplasm: Secondary | ICD-10-CM | POA: Diagnosis not present

## 2023-06-25 DIAGNOSIS — D125 Benign neoplasm of sigmoid colon: Secondary | ICD-10-CM | POA: Diagnosis not present

## 2023-06-25 DIAGNOSIS — Z800 Family history of malignant neoplasm of digestive organs: Secondary | ICD-10-CM

## 2023-06-25 DIAGNOSIS — D123 Benign neoplasm of transverse colon: Secondary | ICD-10-CM

## 2023-06-25 DIAGNOSIS — I10 Essential (primary) hypertension: Secondary | ICD-10-CM | POA: Diagnosis not present

## 2023-06-25 DIAGNOSIS — D120 Benign neoplasm of cecum: Secondary | ICD-10-CM | POA: Diagnosis not present

## 2023-06-25 MED ORDER — SODIUM CHLORIDE 0.9 % IV SOLN
500.0000 mL | Freq: Once | INTRAVENOUS | Status: DC
Start: 2023-06-25 — End: 2023-06-25

## 2023-06-25 NOTE — Op Note (Addendum)
[facility] Patient Name: [name] Procedure Date: 06/25/2023 1:23 PM MRN: [id number] Endoscopist: Viviann Spare P. Adela Lank , MD, [id number] Age: 72 Referring MD:  Date of Birth: Mar 10, 1951 Gender: Male Account #: [id number] Procedure:                Colonoscopy Indications:              High risk colon cancer surveillance: Personal                            history of colonic polyps - last exam 05/2017 - 2                            adenomas, mother had colon cancer dx age 86s Medicines:                Monitored Anesthesia Care Procedure:                Pre-Anesthesia Assessment:                           - Prior to the procedure, a History and Physical                            was performed, and patient medications and                            allergies were reviewed. The patient's tolerance of                            previous anesthesia was also reviewed. The risks                            and benefits of the procedure and the sedation                            options and risks were discussed with the patient.                            All questions were answered, and informed consent                            was obtained. Prior Anticoagulants: The patient has                            taken no anticoagulant or antiplatelet agents. ASA                            Grade Assessment: II - A patient with mild systemic                            disease. After reviewing the risks and benefits,                            the patient was deemed in satisfactory condition to  undergo the procedure.                           After obtaining informed consent, the colonoscope                            was passed under direct vision. Throughout the                            procedure, the patient's blood pressure, pulse, and                            oxygen saturations were monitored continuously. The                            CF [id number]  #[id number] was introduced through the anus                            and advanced to the the cecum, identified by                            appendiceal orifice and ileocecal valve. The                            colonoscopy was performed without difficulty. The                            patient tolerated the procedure well. The quality                            of the bowel preparation was adequate. The                            ileocecal valve, appendiceal orifice, and rectum                            were photographed. Scope In: 1:33:04 PM Scope Out: 1:53:56 PM Scope Withdrawal Time: 0 hours 17 minutes 38 seconds  Total Procedure Duration: 0 hours 20 minutes 52 seconds  Findings:                 The perianal and digital rectal examinations were                            normal.                           A 3 mm polyp was found in the cecum. The polyp was                            sessile. The polyp was removed with a cold snare.                            Resection and retrieval were complete.  Two sessile polyps were found in the transverse                            colon. The polyps were 3 to 4 mm in size. These                            polyps were removed with a cold snare. Resection                            and retrieval were complete.                           A 4 mm polyp was found in the sigmoid colon. The                            polyp was sessile. The polyp was removed with a                            cold snare. Resection and retrieval were complete.                           A 4 mm polyp was found in the recto-sigmoid colon.                            The polyp was sessile. The polyp was removed with a                            cold snare. Resection and retrieval were complete.                           Internal hemorrhoids were found during                            retroflexion. The hemorrhoids were small.                            The exam was otherwise without abnormality. Complications:            No immediate complications. Estimated blood loss:                            Minimal. Estimated Blood Loss:     Estimated blood loss was minimal. Impression:               - One 3 mm polyp in the cecum, removed with a cold                            snare. Resected and retrieved.                           - Two 3 to 4 mm polyps in the transverse colon,  removed with a cold snare. Resected and retrieved.                           - One 4 mm polyp in the sigmoid colon, removed with                            a cold snare. Resected and retrieved.                           - One 4 mm polyp at the recto-sigmoid colon,                            removed with a cold snare. Resected and retrieved.                           - Internal hemorrhoids.                           - The examination was otherwise normal.                           Of note, given breathing pattern observed per                            anesthesia with sedation, suspicious for underlying                            OSA and recommend sleep study if not yet done. Recommendation:           - Patient has a contact number available for                            emergencies. The signs and symptoms of potential                            delayed complications were discussed with the                            patient. Return to normal activities tomorrow.                            Written discharge instructions were provided to the                            patient.                           - Resume previous diet.                           - Continue present medications.                           - Await pathology results.                           -  Recommend sleep study to assess for underlying                            sleep apnea if patient has not had that done yet Viviann Spare P. Tami Barren, MD 06/25/2023 1:59:21 PM This report has  been signed electronically.

## 2023-06-25 NOTE — Progress Notes (Signed)
VS by EC  Pt's states no medical or surgical changes since previsit or office visit.  

## 2023-06-25 NOTE — Progress Notes (Signed)
 Called to room to assist during endoscopic procedure.  Patient ID and intended procedure confirmed with present staff. Received instructions for my participation in the procedure from the performing physician.  

## 2023-06-25 NOTE — Patient Instructions (Addendum)
    Handouts on polyps & hemorrhoids given to you today.   Await pathology results on polyps removed   SLEEP STUDY RECOMMENDED BY DR Adela Lank ( SEE REPORT)   YOU HAD AN ENDOSCOPIC PROCEDURE TODAY AT THE [facility]:   Refer to the procedure report that was given to you for any specific questions about what was found during the examination.  If the procedure report does not answer your questions, please call your gastroenterologist to clarify.  If you requested that your care partner not be given the details of your procedure findings, then the procedure report has been included in a sealed envelope for you to review at your convenience later.  YOU SHOULD EXPECT: Some feelings of bloating in the abdomen. Passage of more gas than usual.  Walking can help get rid of the air that was put into your GI tract during the procedure and reduce the bloating. If you had a lower endoscopy (such as a colonoscopy or flexible sigmoidoscopy) you may notice spotting of blood in your stool or on the toilet paper. If you underwent a bowel prep for your procedure, you may not have a normal bowel movement for a few days.  Please Note:  You might notice some irritation and congestion in your nose or some drainage.  This is from the oxygen used during your procedure.  There is no need for concern and it should clear up in a day or so.  SYMPTOMS TO REPORT IMMEDIATELY:  Following lower endoscopy (colonoscopy or flexible sigmoidoscopy):  Excessive amounts of blood in the stool  Significant tenderness or worsening of abdominal pains  Swelling of the abdomen that is new, acute  Fever of 100F or higher   For urgent or emergent issues, a gastroenterologist can be reached at any hour by calling [phone]. Do not use MyChart messaging for urgent concerns.    DIET:  We do recommend a small meal at first, but then you may proceed to your regular diet.  Drink plenty of fluids but you should avoid  alcoholic beverages for 24 hours.  ACTIVITY:  You should plan to take it easy for the rest of today and you should NOT DRIVE or use heavy machinery until tomorrow (because of the sedation medicines used during the test).    FOLLOW UP: Our staff will call the number listed on your records the next business day following your procedure.  We will call around 7:15- 8:00 am to check on you and address any questions or concerns that you may have regarding the information given to you following your procedure. If we do not reach you, we will leave a message.     If any biopsies were taken you will be contacted by phone or by letter within the next 1-3 weeks.  Please call us at [phone] if you have not heard about the biopsies in 3 weeks.    SIGNATURES/CONFIDENTIALITY: You and/or your care partner have signed paperwork which will be entered into your electronic medical record.  These signatures attest to the fact that that the information above on your After Visit Summary has been reviewed and is understood.  Full responsibility of the confidentiality of this discharge information lies with you and/or your care-partner.

## 2023-06-25 NOTE — Progress Notes (Signed)
History and Physical Interval Note: Patient seen 06/13/23 - here for colonoscopy - last exam 05/2017 - 2 adenomas, also has mother who had colon cancer dx age 82s. Here for a surveillance exam. No interval changes since I have last seen him, otherwise feeling okay today.    06/25/2023 1:24 PM  Kathryne Sharper  has presented today for endoscopic procedure(s), with the diagnosis of  Encounter Diagnoses  Name Primary?   Family history of colon cancer Yes   History of colon polyps   .  The various methods of evaluation and treatment have been discussed with the patient and/or family. After consideration of risks, benefits and other options for treatment, the patient has consented to  the endoscopic procedure(s).   The patient's history has been reviewed, patient examined, no change in status, stable for surgery.  I have reviewed the patient's chart and labs.  Questions were answered to the patient's satisfaction.    Harlin Rain, MD [facility]

## 2023-06-25 NOTE — Progress Notes (Signed)
 Report given to PACU, vss 

## 2023-06-26 ENCOUNTER — Telehealth: Payer: Self-pay | Admitting: *Deleted

## 2023-06-26 ENCOUNTER — Encounter: Payer: Self-pay | Admitting: Internal Medicine

## 2023-06-26 NOTE — Telephone Encounter (Signed)
  Follow up Call-     06/25/2023    1:11 PM  Call back number  Post procedure Call Back phone  # [phone]  Permission to leave phone message Yes     Patient questions:  Do you have a fever, pain , or abdominal swelling? No. Pain Score  0 *  Have you tolerated food without any problems? Yes.    Have you been able to return to your normal activities? Yes.    Do you have any questions about your discharge instructions: Diet   No. Medications  No. Follow up visit  No.  Do you have questions or concerns about your Care? No.  Actions: * If pain score is 4 or above: No action needed, pain <4.

## 2023-06-28 ENCOUNTER — Other Ambulatory Visit: Payer: Self-pay | Admitting: Gastroenterology

## 2023-07-08 ENCOUNTER — Ambulatory Visit: Payer: Medicare Other | Admitting: Student

## 2023-07-23 ENCOUNTER — Encounter (INDEPENDENT_AMBULATORY_CARE_PROVIDER_SITE_OTHER): Payer: Medicare Other | Admitting: Family Medicine

## 2023-07-27 NOTE — Progress Notes (Unsigned)
HPI: Fu hypertension and hyperlipidemia. Echo 6/16 showed normal LV function, mild LVH/LVE and grade 1 diastolic dysfunction. Abd ultrasound 6/16 showed no aneurysm. Ca score 11/20 587. Nuclear study 7/21 showed EF 56 and no ischemia. Since last seen, patient denies dyspnea, chest pain, palpitations or syncope.  Current Outpatient Medications  Medication Sig Dispense Refill   amoxicillin (AMOXIL) 875 MG tablet Take 875 mg by mouth 2 (two) times daily. Dental work     aspirin 81 MG tablet Take 81 mg by mouth daily.     hydrochlorothiazide (MICROZIDE) 12.5 MG capsule TAKE 1 CAPSULE BY MOUTH EVERY DAY 90 capsule 0   lisinopril (ZESTRIL) 40 MG tablet Take 1 tablet (40 mg total) by mouth daily. 90 tablet 3   loratadine (CLARITIN REDITABS) 10 MG dissolvable tablet Take 10 mg by mouth daily.     omeprazole (PRILOSEC) 20 MG capsule Take 1 capsule (20 mg total) by mouth daily. 90 capsule 1   rosuvastatin (CRESTOR) 40 MG tablet Take 1 tablet (40 mg total) by mouth daily. (Patient not taking: Reported on 06/25/2023) 90 tablet 3   No current facility-administered medications for this visit.     Past Medical History:  Diagnosis Date   Benign neoplasm of colon    Esophageal reflux    Esophagitis, unspecified    Fatty liver    Hemorrhoids    Hypertension    Osteoarthrosis, unspecified whether generalized or localized, unspecified site    Other and unspecified hyperlipidemia    Skin cancer of nose    malignant skin cancer   Unspecified essential hypertension     Past Surgical History:  Procedure Laterality Date   COLONOSCOPY     ENDOSCOPIC LUMBAR DISCECTOMY W/ LASER     KNEE SURGERY     Bilateral , REPLACED   microdisctomy  2002   Lumbar spine   Open knee cartilage repair     right total knee arthroplasty     skin cancer removal     WISDOM TOOTH EXTRACTION  1975    Social History   Socioeconomic History   Marital status: Single    Spouse name: Not on file   Number of  children: Not on file   Years of education: Not on file   Highest education level: Not on file  Occupational History   Occupation: Employed    Comment: Self Employed--Financial   Tobacco Use   Smoking status: Former    Current packs/day: 0.00    Average packs/day: 2.5 packs/day for 30.0 years (75.0 ttl pk-yrs)    Types: Cigarettes    Start date: 02/12/1971    Quit date: 02/11/2001    Years since quitting: 22.4   Smokeless tobacco: Never  Vaping Use   Vaping status: Never Used  Substance and Sexual Activity   Alcohol use: Yes    Comment: OCCASIONALLY   Drug use: No   Sexual activity: Not on file  Other Topics Concern   Not on file  Social History Narrative   Not on file   Social Determinants of Health   Financial Resource Strain: Not on file  Food Insecurity: Not on file  Transportation Needs: Not on file  Physical Activity: Not on file  Stress: Not on file  Social Connections: Not on file  Intimate Partner Violence: Not on file    Family History  Problem Relation Age of Onset   Hypertension Mother    Colon cancer Mother    Diabetes Mother  Heart failure Father    Colon cancer Paternal Grandfather    Esophageal cancer Neg Hx    Rectal cancer Neg Hx    Stomach cancer Neg Hx     ROS: no fevers or chills, productive cough, hemoptysis, dysphasia, odynophagia, melena, hematochezia, dysuria, hematuria, rash, seizure activity, orthopnea, PND, pedal edema, claudication. Remaining systems are negative.  Physical Exam: Well-developed well-nourished in no acute distress.  Skin is warm and dry.  HEENT is normal.  Neck is supple.  Chest is clear to auscultation with normal expansion.  Cardiovascular exam is regular rate and rhythm.  Abdominal exam nontender or distended. No masses palpated. Extremities show no edema. neuro grossly intact  EKG Interpretation Date/Time:  Wednesday July 30 2023 15:28:23 EDT Ventricular Rate:  67 PR Interval:  186 QRS  Duration:  102 QT Interval:  430 QTC Calculation: 454 R Axis:   70  Text Interpretation: Sinus rhythm with occasional Premature ventricular complexes When compared with ECG of 29-Dec-2000 21:46, Premature ventricular complexes are now Present Nonspecific T wave abnormality no longer evident in Lateral leads Confirmed by Olga Millers ([id number]) on 07/30/2023 3:30:45 PM    A/P  1 CAD-based on elevated Ca score; FU nuclear study showed no ischemia. Continue ASA and statin.   2 Hypertension-BP controlled; continue present meds and follow.  Check potassium and renal function.  3 Hyperlipidemia-continue statin.  Check lipids and liver.  4 obesity-we discussed the importance of weight loss.  5 patient in the process of trying to find a primary care doctor.  I will check his hemoglobin and TSH for screening purposes.  Check hemoglobin A1c.  Olga Millers, MD

## 2023-07-30 ENCOUNTER — Encounter: Payer: Self-pay | Admitting: Cardiology

## 2023-07-30 ENCOUNTER — Ambulatory Visit: Payer: Medicare Other | Admitting: Cardiology

## 2023-07-30 VITALS — BP 124/78 | HR 67 | Ht 72.0 in | Wt 268.8 lb

## 2023-07-30 DIAGNOSIS — I10 Essential (primary) hypertension: Secondary | ICD-10-CM | POA: Diagnosis not present

## 2023-07-30 DIAGNOSIS — I2510 Atherosclerotic heart disease of native coronary artery without angina pectoris: Secondary | ICD-10-CM

## 2023-07-30 DIAGNOSIS — E7800 Pure hypercholesterolemia, unspecified: Secondary | ICD-10-CM

## 2023-07-30 NOTE — Patient Instructions (Signed)

## 2023-08-04 DIAGNOSIS — E785 Hyperlipidemia, unspecified: Secondary | ICD-10-CM | POA: Diagnosis not present

## 2023-08-04 DIAGNOSIS — I2510 Atherosclerotic heart disease of native coronary artery without angina pectoris: Secondary | ICD-10-CM | POA: Diagnosis not present

## 2023-08-04 DIAGNOSIS — E7800 Pure hypercholesterolemia, unspecified: Secondary | ICD-10-CM | POA: Diagnosis not present

## 2023-08-04 DIAGNOSIS — I10 Essential (primary) hypertension: Secondary | ICD-10-CM | POA: Diagnosis not present

## 2023-08-04 DIAGNOSIS — R0600 Dyspnea, unspecified: Secondary | ICD-10-CM | POA: Diagnosis not present

## 2023-08-05 LAB — COMPREHENSIVE METABOLIC PANEL
ALT: 23 IU/L (ref 0–44)
AST: 18 IU/L (ref 0–40)
Albumin: 4.2 g/dL (ref 3.8–4.8)
Alkaline Phosphatase: 92 IU/L (ref 44–121)
BUN/Creatinine Ratio: 16 (ref 10–24)
BUN: 16 mg/dL (ref 8–27)
Bilirubin Total: 0.4 mg/dL (ref 0.0–1.2)
CO2: 26 mmol/L (ref 20–29)
Calcium: 9.7 mg/dL (ref 8.6–10.2)
Chloride: 103 mmol/L (ref 96–106)
Creatinine, Ser: 1.01 mg/dL (ref 0.76–1.27)
Globulin, Total: 2.8 g/dL (ref 1.5–4.5)
Glucose: 106 mg/dL — ABNORMAL HIGH (ref 70–99)
Potassium: 5 mmol/L (ref 3.5–5.2)
Sodium: 143 mmol/L (ref 134–144)
Total Protein: 7 g/dL (ref 6.0–8.5)
eGFR: 79 mL/min/{1.73_m2} (ref >59)

## 2023-08-05 LAB — CBC
Hematocrit: 46.1 % (ref 37.5–51.0)
Hemoglobin: 15.2 g/dL (ref 13.0–17.7)
MCH: 30.4 pg (ref 26.6–33.0)
MCHC: 33 g/dL (ref 31.5–35.7)
MCV: 92 fL (ref 79–97)
Platelets: 232 10*3/uL (ref 150–450)
RBC: 5 x10E6/uL (ref 4.14–5.80)
RDW: 13.5 % (ref 11.6–15.4)
WBC: 7.9 10*3/uL (ref 3.4–10.8)

## 2023-08-05 LAB — LIPID PANEL
Chol/HDL Ratio: 6.4 ratio — ABNORMAL HIGH (ref 0.0–5.0)
Cholesterol, Total: 178 mg/dL (ref 100–199)
HDL: 28 mg/dL — ABNORMAL LOW (ref >39)
LDL Chol Calc (NIH): 95 mg/dL (ref 0–99)
Triglycerides: 328 mg/dL — ABNORMAL HIGH (ref 0–149)
VLDL Cholesterol Cal: 55 mg/dL — ABNORMAL HIGH (ref 5–40)

## 2023-08-05 LAB — HEMOGLOBIN A1C
Est. average glucose Bld gHb Est-mCnc: 137 mg/dL
Hgb A1c MFr Bld: 6.4 % — ABNORMAL HIGH (ref 4.8–5.6)

## 2023-08-05 LAB — TSH: TSH: 1.63 u[IU]/mL (ref 0.450–4.500)

## 2023-08-06 ENCOUNTER — Other Ambulatory Visit: Payer: Self-pay | Admitting: *Deleted

## 2023-08-06 DIAGNOSIS — E7800 Pure hypercholesterolemia, unspecified: Secondary | ICD-10-CM

## 2023-08-06 MED ORDER — EZETIMIBE 10 MG PO TABS: 10.0000 mg | ORAL_TABLET | Freq: Every day | ORAL | 3 refills | Status: DC

## 2023-08-07 ENCOUNTER — Encounter: Payer: Self-pay | Admitting: Orthopedic Surgery

## 2023-08-07 ENCOUNTER — Ambulatory Visit (INDEPENDENT_AMBULATORY_CARE_PROVIDER_SITE_OTHER): Payer: Medicare Other | Admitting: Orthopedic Surgery

## 2023-08-07 ENCOUNTER — Ambulatory Visit
Admission: RE | Admit: 2023-08-07 | Discharge: 2023-08-07 | Disposition: A | Discharge status: Home / Self Care | Payer: Medicare Other | Source: Ambulatory Visit | Attending: Orthopedic Surgery | Admitting: Orthopedic Surgery

## 2023-08-07 VITALS — BP 106/80 | HR 60 | Temp 97.7°F | Resp 18 | Ht 72.0 in | Wt 272.0 lb

## 2023-08-07 DIAGNOSIS — E1141 Type 2 diabetes mellitus with diabetic mononeuropathy: Secondary | ICD-10-CM

## 2023-08-07 DIAGNOSIS — E782 Mixed hyperlipidemia: Secondary | ICD-10-CM

## 2023-08-07 DIAGNOSIS — R140 Abdominal distension (gaseous): Secondary | ICD-10-CM

## 2023-08-07 DIAGNOSIS — Z85828 Personal history of other malignant neoplasm of skin: Secondary | ICD-10-CM | POA: Diagnosis not present

## 2023-08-07 DIAGNOSIS — I10 Essential (primary) hypertension: Secondary | ICD-10-CM

## 2023-08-07 DIAGNOSIS — E781 Pure hyperglyceridemia: Secondary | ICD-10-CM

## 2023-08-07 DIAGNOSIS — I2510 Atherosclerotic heart disease of native coronary artery without angina pectoris: Secondary | ICD-10-CM

## 2023-08-07 DIAGNOSIS — Z6836 Body mass index (BMI) 36.0-36.9, adult: Secondary | ICD-10-CM

## 2023-08-07 DIAGNOSIS — E661 Drug-induced obesity: Secondary | ICD-10-CM

## 2023-08-07 DIAGNOSIS — E1165 Type 2 diabetes mellitus with hyperglycemia: Secondary | ICD-10-CM

## 2023-08-07 DIAGNOSIS — R21 Rash and other nonspecific skin eruption: Secondary | ICD-10-CM

## 2023-08-07 MED ORDER — ROSUVASTATIN CALCIUM 40 MG PO TABS
40.0000 mg | ORAL_TABLET | Freq: Every day | ORAL | 3 refills | Status: DC
Start: 2023-08-07 — End: 2023-11-20

## 2023-08-07 MED ORDER — METFORMIN HCL ER 500 MG PO TB24
500.0000 mg | ORAL_TABLET | Freq: Every day | ORAL | 1 refills | Status: DC
Start: 2023-08-07 — End: 2023-11-20

## 2023-08-07 NOTE — Patient Instructions (Addendum)
NO SODA!!!  Focus on diet that limits you to 1 carb daily  Will start metformin 500 mg daily( take with breakfast) for new onset diabetes  You may go to [facility] to have abdominal xray done> [address]  Take Crestor at night  Do not take motrin> EVER!!!  Tylenol ok

## 2023-08-07 NOTE — Progress Notes (Signed)
Careteam: Patient Care Team: Octavia Heir, NP as PCP - General (Adult Health Nurse Practitioner) Adela Lank, Willaim Rayas, MD as Consulting Physician (Gastroenterology) [facility] (Specialist) Lewayne Bunting, MD as Consulting Physician (Cardiology)  Seen by: Hazle Nordmann, AGNP-C  PLACE OF SERVICE:  [facility]  Advanced Directive information Does Patient Have a Medical Advance Directive?: Yes, Type of Advance Directive: Healthcare Power of [place];Living will;Out of facility DNR (pink MOST or yellow form), Does patient want to make changes to medical advance directive?: No - Patient declined  Allergies  Allergen Reactions   Niacin Other (See Comments)    REACTION: flushing REACTION: flushing    Chief Complaint  Patient presents with   Establish Care    New Patient.     HPI: Patient is a 72 y.o. male seen today to establish with [facility].   Past patient of Dr. Theotis Burrow, he had not seen PCP for a couple years and was unable to reestablish. From [place]. 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 Companion of 30 years, Darl Pikes present today. 1 son, lives close by. Lives in 2 story home, 12 stairs in home.   No past MI.   HTN- diagnosed in 40's, remains on HCTZ and lisinopril, does not check pressures at home, moderate salt use, he is followed by Dr. Jens Som, remains on lisinopril and hydrochlorothiazide  CAD- elevated calcium score, Nuclear stress test with no ischemia, remains on asa and statin ( not taking per patient)  T2DM- recent A1c 6.4> ordered by cardiology, diet high in carbs and drinks soda daily, companion reports AM sugars around 100, admits to neuropathy to lower legs  Hyperlipidemia/Hypertriglyceridemia- Total 178, LDL 95, triglycerides 328 03/47/4259  Abdominal distension- uneven abdominal symmetry, does not know how long he has had it, denies abdominal pain or blood in stool, mother with h/o  colon cancer, last colonoscopy 06/25/2023> multiple polyps  Obesity- BMI 36.89  Skin cancer- h/o Mohs surgery x 2, scheduled to see new dermatologist next week  Specialists: Dr. Einar P[facility]asant [facility], [facility], Dr. Jens Som cardiology  No recent hospitalizations.   Past surgeries:  Total knee arthroplasty x 2> Dr. [name] 2006  Back surgery 2002  Family history:  Father passed age 3> CAD, leukemia, heart disease  Mother passed age 62> colon cancer, CHF, Alzheimer's dementia  No siblings  Paternal grandfather> colon cancer   Smoking: quit smoking 20 years ago, smoked 2 PPD prior to quitting for 30-35 years Alcohol: 1-2 beverages per month, no past use or abuse No past drug use or abuse  Does not follow scheduled exercise regimen Diet: eats 2 meals daily, one usually fast food, second meal with one protein/vegetable and 2 carbs, drinking about 3 ginger ales daily  He has HPOA and living will.     Review of Systems:  Review of Systems  Constitutional:  Negative for fever and malaise/fatigue.  HENT:  Negative for congestion and sore throat.   Eyes:  Negative for blurred vision.  Respiratory:  Positive for shortness of breath. Negative for cough and wheezing.   Cardiovascular:  Negative for chest pain and leg swelling.  Genitourinary:  Positive for frequency.  Musculoskeletal:  Positive for back pain.  Skin:  Positive for rash.  Neurological:  Positive for sensory change.  Psychiatric/Behavioral:  Negative for depression. The patient is not nervous/anxious.     Past Medical History:  Diagnosis Date   Benign neoplasm of colon  Esophageal reflux    Esophagitis, unspecified    Fatty liver    Hemorrhoids    High cholesterol    Hypertension    Osteoarthrosis, unspecified whether generalized or localized, unspecified site    Other and unspecified hyperlipidemia    Skin cancer of nose    malignant skin cancer   Unspecified essential hypertension    Past  Surgical History:  Procedure Laterality Date   BACK SURGERY  2002   COLONOSCOPY     ENDOSCOPIC LUMBAR DISCECTOMY W/ LASER     KNEE SURGERY     Bilateral , REPLACED   LIVER ULTRASOUND  2024   microdisctomy  12/09/2000   Lumbar spine   Open knee cartilage repair     right total knee arthroplasty     skin cancer removal     WISDOM TOOTH EXTRACTION  12/09/1973   Social History:   reports that he quit smoking about 22 years ago. His smoking use included cigarettes. He started smoking about 52 years ago. He has a 75 pack-year smoking history. He has never used smokeless tobacco. He reports current alcohol use. He reports that he does not use drugs.  Family History  Problem Relation Age of Onset   Hypertension Mother    Colon cancer Mother    Diabetes Mother    Heart failure Father    Colon cancer Paternal Grandfather    Esophageal cancer Neg Hx    Rectal cancer Neg Hx    Stomach cancer Neg Hx     Medications: Patient's Medications  New Prescriptions   No medications on file  Previous Medications   AMOXICILLIN (AMOXIL) 875 MG TABLET    Take 875 mg by mouth 2 (two) times daily. Dental work   ASPIRIN 81 MG TABLET    Take 81 mg by mouth daily.   HYDROCHLOROTHIAZIDE (MICROZIDE) 12.5 MG CAPSULE    TAKE 1 CAPSULE BY MOUTH EVERY DAY   LISINOPRIL (ZESTRIL) 40 MG TABLET    Take 1 tablet (40 mg total) by mouth daily.   LORATADINE (CLARITIN REDITABS) 10 MG DISSOLVABLE TABLET    Take 10 mg by mouth daily.   OMEPRAZOLE (PRILOSEC) 20 MG CAPSULE    Take 1 capsule (20 mg total) by mouth daily.  Modified Medications   No medications on file  Discontinued Medications   EZETIMIBE (ZETIA) 10 MG TABLET    Take 1 tablet (10 mg total) by mouth daily.   ROSUVASTATIN (CRESTOR) 40 MG TABLET    Take 1 tablet (40 mg total) by mouth daily.    Physical Exam:  Vitals:   08/07/23 1350  BP: 106/80  Pulse: 60  Resp: 18  Temp: 97.7 F (36.5 C)  SpO2: 96%  Weight: 272 lb (123.4 kg)  Height: 6' (1.829  m)   Body mass index is 36.89 kg/m. Wt Readings from Last 3 Encounters:  08/07/23 272 lb (123.4 kg)  07/30/23 268 lb 12.8 oz (121.9 kg)  06/25/23 271 lb (122.9 kg)    Physical Exam Vitals reviewed.  Constitutional:      Appearance: He is obese.  HENT:     Head: Normocephalic.  Eyes:     General:        Right eye: No discharge.        Left eye: No discharge.  Cardiovascular:     Rate and Rhythm: Normal rate and regular rhythm.     Pulses: Normal pulses.     Heart sounds: Normal heart sounds.  Pulmonary:  Effort: Pulmonary effort is normal. No respiratory distress.     Breath sounds: Normal breath sounds. No wheezing.  Abdominal:     General: Bowel sounds are normal. There is distension.     Palpations: Abdomen is soft. There is no mass.     Tenderness: There is no abdominal tenderness. There is no guarding or rebound.     Hernia: No hernia is present.     Comments: Asymmetrical appearance of abdomen, L>R  Musculoskeletal:     Cervical back: Neck supple.     Right lower leg: No edema.     Left lower leg: No edema.  Skin:    General: Skin is warm.     Capillary Refill: Capillary refill takes less than 2 seconds.     Findings: Rash present.     Comments: Dry flaking skin left lower leg from knee to ankle, small pinpoint areas of redness, no warmth/tenderness/drainage  Neurological:     General: No focal deficit present.     Mental Status: He is alert and oriented to person, place, and time.  Psychiatric:        Mood and Affect: Mood normal.     Labs reviewed: Basic Metabolic Panel: Recent Labs    06/13/23 1124 08/04/23 0833  NA 139 143  K 3.5 5.0  CL 101 103  CO2 29 26  GLUCOSE 122* 106*  BUN 21 16  CREATININE 1.22 1.01  CALCIUM 9.8 9.7  TSH  --  1.630   Liver Function Tests: Recent Labs    06/13/23 1124 08/04/23 0833  AST 16 18  ALT 17 23  ALKPHOS 77 92  BILITOT 0.6 0.4  PROT 7.4 7.0  ALBUMIN 4.2 4.2   No results for input(s): "LIPASE",  "AMYLASE" in the last 8760 hours. No results for input(s): "AMMONIA" in the last 8760 hours. CBC: Recent Labs    06/13/23 1124 08/04/23 0833  WBC 7.6 7.9  NEUTROABS 4.2  --   HGB 15.2 15.2  HCT 45.5 46.1  MCV 91.4 92  PLT 243.0 232   Lipid Panel: Recent Labs    08/04/23 0833  CHOL 178  HDL 28*  LDLCALC 95  TRIG 161*  CHOLHDL 6.4*   TSH: Recent Labs    08/04/23 0833  TSH 1.630   A1C: Lab Results  Component Value Date   HGBA1C 6.4 (H) 08/04/2023     Assessment/Plan 1. Type 2 diabetes mellitus with hyperglycemia, without long-term current use of insulin (HCC) - A1c 6.4 (08/26)> was 6.1 (11/2019) - home AM sugars around 100 per companion - neuropathy to lower legs - cont asa and lisinopril - discussed statin therapy> he agrees to restart - advised to stop soda - discussed low carb and sugar diet - discussed weight loss - plan to recheck A1c in 3 months - needs urine microalbumin> next visit - metFORMIN (GLUCOPHAGE-XR) 500 MG 24 hr tablet; Take 1 tablet (500 mg total) by mouth daily with breakfast.  Dispense: 90 tablet; Refill: 1  2. Essential hypertension - controlled - cont lisinopril and HCTZ  3. Abdominal distension - unknown time frame - abdomen asymmetrical, L>R, asymptomatic - h/o colon cancer - DG Abd 1 View; Future  4. Mixed hyperlipidemia - total 178, LDL 95 (08/26)> LDL goal < 70 due to T2DM - rosuvastatin (CRESTOR) 40 MG tablet; Take 1 tablet (40 mg total) by mouth daily.  Dispense: 90 tablet; Refill: 3  5. Coronary artery disease involving native coronary artery of native heart without  angina pectoris - followed by Dr. Jens Som - elevated calcium score - nuclear stress> no ischemia - cont asa and statin  6. Hypertriglyceridemia - triglycerides 328 - now on statin - discussed low carb diet - recheck in 3 months> no improvement> start fenofibrate  7. Class 2 drug-induced obesity with serious comorbidity and body mass index (BMI) of  36.0 to 36.9 in adult - BMI 36.89 - recommend counting calories, < 2000/day - recommend light exercise 150 min/week  8. History of skin cancer - mohs surgery x 2 in past  9. Rash and nonspecific skin eruption - involves LLE - dermatology appointment next week  Future tests: A1c/ lipid panel in 3 months, urine microalbumin next visit, MMSE, discuss AWV/ vaccinations  Total time: 62 minutes. Greater than 50% of total time spent doing patient education regarding health maintenance, T2DM, HTN, HLD, obesity and weight loss including symptom/medication management.     Next appt: 09/04/2023  Hazle Nordmann, Juel Burrow  [facility] [phone]

## 2023-08-12 DIAGNOSIS — B358 Other dermatophytoses: Secondary | ICD-10-CM | POA: Diagnosis not present

## 2023-08-27 DIAGNOSIS — R350 Frequency of micturition: Secondary | ICD-10-CM | POA: Diagnosis not present

## 2023-08-28 ENCOUNTER — Encounter: Payer: Self-pay | Admitting: Gastroenterology

## 2023-08-28 ENCOUNTER — Encounter: Payer: Self-pay | Admitting: Cardiology

## 2023-08-31 ENCOUNTER — Other Ambulatory Visit: Payer: Self-pay | Admitting: Cardiology

## 2023-09-03 DIAGNOSIS — B358 Other dermatophytoses: Secondary | ICD-10-CM | POA: Diagnosis not present

## 2023-09-03 DIAGNOSIS — L821 Other seborrheic keratosis: Secondary | ICD-10-CM | POA: Diagnosis not present

## 2023-09-03 DIAGNOSIS — D225 Melanocytic nevi of trunk: Secondary | ICD-10-CM | POA: Diagnosis not present

## 2023-09-03 DIAGNOSIS — L814 Other melanin hyperpigmentation: Secondary | ICD-10-CM | POA: Diagnosis not present

## 2023-09-04 ENCOUNTER — Encounter: Payer: Self-pay | Admitting: Orthopedic Surgery

## 2023-09-04 ENCOUNTER — Ambulatory Visit (INDEPENDENT_AMBULATORY_CARE_PROVIDER_SITE_OTHER): Payer: Medicare Other | Admitting: Orthopedic Surgery

## 2023-09-04 VITALS — BP 132/66 | HR 55 | Temp 96.8°F | Resp 17 | Ht 72.0 in | Wt 260.0 lb

## 2023-09-04 DIAGNOSIS — I10 Essential (primary) hypertension: Secondary | ICD-10-CM

## 2023-09-04 DIAGNOSIS — R140 Abdominal distension (gaseous): Secondary | ICD-10-CM | POA: Diagnosis not present

## 2023-09-04 DIAGNOSIS — E781 Pure hyperglyceridemia: Secondary | ICD-10-CM

## 2023-09-04 DIAGNOSIS — R7303 Prediabetes: Secondary | ICD-10-CM | POA: Diagnosis not present

## 2023-09-04 DIAGNOSIS — E11628 Type 2 diabetes mellitus with other skin complications: Secondary | ICD-10-CM | POA: Diagnosis not present

## 2023-09-04 DIAGNOSIS — E669 Obesity, unspecified: Secondary | ICD-10-CM

## 2023-09-04 DIAGNOSIS — K760 Fatty (change of) liver, not elsewhere classified: Secondary | ICD-10-CM | POA: Diagnosis not present

## 2023-09-04 NOTE — Patient Instructions (Signed)
PLEASE COME FASTING NEXT VISIT

## 2023-09-04 NOTE — Progress Notes (Signed)
Careteam: Patient Care Team: Octavia Heir, NP as PCP - General (Adult Health Nurse Practitioner) Adela Lank, Willaim Rayas, MD as Consulting Physician (Gastroenterology) [facility] (Specialist) Lewayne Bunting, MD as Consulting Physician (Cardiology)  Seen by: Hazle Nordmann, AGNP-C  PLACE OF SERVICE:  [facility]  Advanced Directive information Does Patient Have a Medical Advance Directive?: Yes, Type of Advance Directive: Healthcare Power of [place];Living will;Out of facility DNR (pink MOST or yellow form), Does patient want to make changes to medical advance directive?: No - Patient declined  Allergies  Allergen Reactions   Niacin Other (See Comments)    REACTION: flushing REACTION: flushing    Chief Complaint  Patient presents with   Follow-up    New patient 4 week follow up.      HPI: Patient is a 72 y.o. male seen today for medical management of chronic conditions.   Recent A1c 6.4. Metformin prescribed, but he has not started. He would like to try diet and weight loss interventions first. He quit drinking soda and his weight is down 12 lbs. He checks sugars in AM, averaging around 110's. No hypoglycemias. Current weight 260 lbs> was 272 lbs.   Abdominal CXR unremarkable. He continues to have asymmetrical abdominal distension. Denies abdominal pain or bowel issues.   Recently saw dermatology. Rash to RLE fungal. He is currently taking terbinafine and clobetasol cream. Stopped wearing big boots. Improved itching and signs of healing present.   Blood pressure 132/66.   No recent falls or injuries.   Review of Systems:  Review of Systems  Constitutional: Negative.   HENT: Negative.    Eyes: Negative.   Respiratory: Negative.    Cardiovascular: Negative.   Gastrointestinal: Negative.   Genitourinary: Negative.   Musculoskeletal: Negative.   Skin: Negative.   Neurological: Negative.   Endo/Heme/Allergies: Negative.   Psychiatric/Behavioral: Negative.       Past Medical History:  Diagnosis Date   Benign neoplasm of colon    Esophageal reflux    Esophagitis, unspecified    Fatty liver    Hemorrhoids    High cholesterol    Hypertension    Osteoarthrosis, unspecified whether generalized or localized, unspecified site    Other and unspecified hyperlipidemia    Skin cancer of nose    malignant skin cancer   Unspecified essential hypertension    Past Surgical History:  Procedure Laterality Date   BACK SURGERY  2002   COLONOSCOPY     ENDOSCOPIC LUMBAR DISCECTOMY W/ LASER     KNEE SURGERY     Bilateral , REPLACED   LIVER ULTRASOUND  2024   microdisctomy  12/09/2000   Lumbar spine   Open knee cartilage repair     right total knee arthroplasty     skin cancer removal     WISDOM TOOTH EXTRACTION  12/09/1973   Social History:   reports that he quit smoking about 22 years ago. His smoking use included cigarettes. He started smoking about 52 years ago. He has a 75 pack-year smoking history. He has never used smokeless tobacco. He reports current alcohol use. He reports that he does not use drugs.  Family History  Problem Relation Age of Onset   Hypertension Mother    Colon cancer Mother    Diabetes Mother    Heart failure Father    Colon cancer Paternal Grandfather    Esophageal cancer Neg Hx    Rectal cancer Neg Hx    Stomach cancer Neg Hx  Medications: Patient's Medications  New Prescriptions   No medications on file  Previous Medications   AMOXICILLIN (AMOXIL) 875 MG TABLET    Take 875 mg by mouth 2 (two) times daily. Dental work   ASPIRIN 81 MG TABLET    Take 81 mg by mouth daily.   HYDROCHLOROTHIAZIDE (MICROZIDE) 12.5 MG CAPSULE    TAKE 1 CAPSULE BY MOUTH EVERY DAY   LISINOPRIL (ZESTRIL) 40 MG TABLET    Take 1 tablet (40 mg total) by mouth daily.   LORATADINE (CLARITIN REDITABS) 10 MG DISSOLVABLE TABLET    Take 10 mg by mouth daily.   METFORMIN (GLUCOPHAGE-XR) 500 MG 24 HR TABLET    Take 1 tablet (500 mg total) by  mouth daily with breakfast.   OMEPRAZOLE (PRILOSEC) 20 MG CAPSULE    Take 1 capsule (20 mg total) by mouth daily.   ROSUVASTATIN (CRESTOR) 40 MG TABLET    Take 1 tablet (40 mg total) by mouth daily.  Modified Medications   No medications on file  Discontinued Medications   No medications on file    Physical Exam:  Vitals:   09/04/23 1402  BP: 132/66  Pulse: (!) 55  Resp: 17  Temp: (!) 96.8 F (36 C)  SpO2: 97%  Weight: 260 lb (117.9 kg)  Height: 6' (1.829 m)   Body mass index is 35.26 kg/m. Wt Readings from Last 3 Encounters:  09/04/23 260 lb (117.9 kg)  08/07/23 272 lb (123.4 kg)  07/30/23 268 lb 12.8 oz (121.9 kg)    Physical Exam Vitals reviewed.  Constitutional:      Appearance: He is obese.  HENT:     Head: Normocephalic.     Right Ear: There is no impacted cerumen.     Left Ear: There is no impacted cerumen.     Nose: Nose normal.     Mouth/Throat:     Mouth: Mucous membranes are moist.  Eyes:     General:        Right eye: No discharge.        Left eye: No discharge.  Neck:     Thyroid: No thyroid mass or thyromegaly.  Cardiovascular:     Rate and Rhythm: Normal rate and regular rhythm.     Pulses: Normal pulses.     Heart sounds: Normal heart sounds.  Pulmonary:     Effort: Pulmonary effort is normal.     Breath sounds: Normal breath sounds.  Abdominal:     General: Bowel sounds are normal. There is no distension.     Palpations: Abdomen is soft. There is no mass.     Tenderness: There is no abdominal tenderness. There is no guarding or rebound.     Hernia: No hernia is present.  Musculoskeletal:     Cervical back: Neck supple.     Right lower leg: No edema.     Left lower leg: No edema.  Skin:    General: Skin is warm.     Capillary Refill: Capillary refill takes less than 2 seconds.     Findings: Rash present.     Comments: Fungal rash to RLE, scattered circular areas, flaking appearance, no sign of infection  Neurological:     General:  No focal deficit present.     Mental Status: He is alert and oriented to person, place, and time.  Psychiatric:        Mood and Affect: Mood normal.     Labs reviewed: Basic Metabolic Panel: Recent  Labs    06/13/23 1124 08/04/23 0833  NA 139 143  K 3.5 5.0  CL 101 103  CO2 29 26  GLUCOSE 122* 106*  BUN 21 16  CREATININE 1.22 1.01  CALCIUM 9.8 9.7  TSH  --  1.630   Liver Function Tests: Recent Labs    06/13/23 1124 08/04/23 0833  AST 16 18  ALT 17 23  ALKPHOS 77 92  BILITOT 0.6 0.4  PROT 7.4 7.0  ALBUMIN 4.2 4.2   No results for input(s): "LIPASE", "AMYLASE" in the last 8760 hours. No results for input(s): "AMMONIA" in the last 8760 hours. CBC: Recent Labs    06/13/23 1124 08/04/23 0833  WBC 7.6 7.9  NEUTROABS 4.2  --   HGB 15.2 15.2  HCT 45.5 46.1  MCV 91.4 92  PLT 243.0 232   Lipid Panel: Recent Labs    08/04/23 0833  CHOL 178  HDL 28*  LDLCALC 95  TRIG 161*  CHOLHDL 6.4*   TSH: Recent Labs    08/04/23 0833  TSH 1.630   A1C: Lab Results  Component Value Date   HGBA1C 6.4 (H) 08/04/2023     Assessment/Plan 1. Type 2 diabetes mellitus with other skin complication, without long-term current use of insulin (HCC) - A1c 6.4 - metformin recommended> refused - will try diet and weight loss x 3 months - weight down 12 lbs in 1 month - on ACE and statin - cont low carb diet and limit sugars - repeat A1c in 3 months - Microalbumin/Creatinine Ratio, Urine  2. Abdominal distension - 08/29 KUB unremarkable - asymptomatic   3. Hypertriglyceridemia - Triglycerides 328 - recheck in 3 months> no improvement> start fenofibrate  4. Essential hypertension - controlled - cont lisinopril and HCTZ   5. Obesity, Class II, BMI 35-39.9 - weight down 12 lbs - TSH 1.630 - stopped drinking soda - cont weight loss efforts  6. Fatty Liver - 06/18/2023 RUQ ultrasound noted mild hepatic steatosis - AST/ALT 18/23  Future labs/tests: A1c, lipid  panel  Total time: 34 minutes. Greater than 50% of total time spent doing patient education regarding health maintenance, T2DM, HTN, weight loss and lab results including symptom/medication management.    Next appt: 11/20/2023  Hazle Nordmann, Juel Burrow  [facility] [phone]

## 2023-09-05 ENCOUNTER — Other Ambulatory Visit: Payer: Self-pay | Admitting: Orthopedic Surgery

## 2023-09-05 ENCOUNTER — Encounter: Payer: Self-pay | Admitting: Orthopedic Surgery

## 2023-09-05 DIAGNOSIS — E11628 Type 2 diabetes mellitus with other skin complications: Secondary | ICD-10-CM

## 2023-09-05 LAB — MICROALBUMIN / CREATININE URINE RATIO
Creatinine, Urine: 77 mg/dL (ref 20–320)
Microalb, Ur: <0.2 mg/dL

## 2023-09-05 MED ORDER — BLOOD GLUCOSE MONITORING SUPPL DEVI: 1.0000 | Freq: Three times a day (TID) | 0 refills | Status: DC

## 2023-09-05 MED ORDER — LANCET DEVICE MISC
1.0000 | Freq: Three times a day (TID) | 0 refills | Status: AC
Start: 2023-09-05 — End: 2023-10-05

## 2023-09-05 MED ORDER — FREESTYLE LIBRE 2 READER DEVI: 1.0000 | Freq: Every day | 0 refills | Status: DC

## 2023-09-05 MED ORDER — FREESTYLE LIBRE 2 SENSOR MISC: 1.0000 | 11 refills | Status: AC

## 2023-09-05 MED ORDER — BLOOD GLUCOSE TEST VI STRP
1.0000 | ORAL_STRIP | Freq: Three times a day (TID) | 0 refills | Status: AC
Start: 2023-09-05 — End: 2023-10-05

## 2023-09-05 MED ORDER — LANCETS MISC. MISC
1.0000 | Freq: Three times a day (TID) | 0 refills | Status: AC
Start: 2023-09-05 — End: 2023-10-05

## 2023-09-17 DIAGNOSIS — M542 Cervicalgia: Secondary | ICD-10-CM | POA: Diagnosis not present

## 2023-09-17 DIAGNOSIS — M25511 Pain in right shoulder: Secondary | ICD-10-CM | POA: Diagnosis not present

## 2023-09-18 ENCOUNTER — Encounter: Payer: Self-pay | Admitting: Orthopedic Surgery

## 2023-09-18 DIAGNOSIS — L810 Postinflammatory hyperpigmentation: Secondary | ICD-10-CM | POA: Diagnosis not present

## 2023-09-18 DIAGNOSIS — B358 Other dermatophytoses: Secondary | ICD-10-CM | POA: Diagnosis not present

## 2023-09-18 NOTE — Telephone Encounter (Signed)
Call placed to [facility] to request note

## 2023-09-22 DIAGNOSIS — M542 Cervicalgia: Secondary | ICD-10-CM | POA: Diagnosis not present

## 2023-09-25 ENCOUNTER — Other Ambulatory Visit: Payer: Self-pay | Admitting: Orthopedic Surgery

## 2023-09-25 DIAGNOSIS — E11628 Type 2 diabetes mellitus with other skin complications: Secondary | ICD-10-CM

## 2023-09-27 ENCOUNTER — Emergency Department (HOSPITAL_BASED_OUTPATIENT_CLINIC_OR_DEPARTMENT_OTHER)
Admission: EM | Admit: 2023-09-27 | Discharge: 2023-09-27 | Disposition: A | Discharge status: Home / Self Care | Payer: Medicare Other | Attending: Emergency Medicine | Admitting: Emergency Medicine

## 2023-09-27 ENCOUNTER — Encounter (HOSPITAL_BASED_OUTPATIENT_CLINIC_OR_DEPARTMENT_OTHER): Payer: Self-pay

## 2023-09-27 ENCOUNTER — Other Ambulatory Visit: Payer: Self-pay

## 2023-09-27 DIAGNOSIS — Z85828 Personal history of other malignant neoplasm of skin: Secondary | ICD-10-CM | POA: Diagnosis not present

## 2023-09-27 DIAGNOSIS — Z79899 Other long term (current) drug therapy: Secondary | ICD-10-CM | POA: Insufficient documentation

## 2023-09-27 DIAGNOSIS — M542 Cervicalgia: Secondary | ICD-10-CM | POA: Diagnosis present

## 2023-09-27 DIAGNOSIS — Z794 Long term (current) use of insulin: Secondary | ICD-10-CM | POA: Diagnosis not present

## 2023-09-27 DIAGNOSIS — M5412 Radiculopathy, cervical region: Secondary | ICD-10-CM | POA: Diagnosis not present

## 2023-09-27 DIAGNOSIS — Z7982 Long term (current) use of aspirin: Secondary | ICD-10-CM | POA: Diagnosis not present

## 2023-09-27 DIAGNOSIS — I10 Essential (primary) hypertension: Secondary | ICD-10-CM | POA: Diagnosis not present

## 2023-09-27 MED ORDER — METHYLPREDNISOLONE 4 MG PO TBPK: ORAL_TABLET | ORAL | 0 refills | Status: DC

## 2023-09-27 MED ORDER — PREDNISONE 20 MG PO TABS
40.0000 mg | ORAL_TABLET | Freq: Once | ORAL | Status: AC
  Administered 2023-09-27: 40 mg via ORAL
  Filled 2023-09-27: qty 2

## 2023-09-27 NOTE — Discharge Instructions (Addendum)
Keep an eye on your sugars.  They could go more out of control being on the steroids.  Follow-up with Dr. Shon Baton as planned.

## 2023-09-27 NOTE — ED Triage Notes (Signed)
He c/o non-traumatic right neck and shoulder area pain x 1 week. He also c/o some incoordination of his right hand "I occasionally drop a cup I'm holding". He denies any paraesthesia(s) of any extremity, including all fingers and toes bilat. He is ambulatory and in no distress.

## 2023-09-28 NOTE — ED Provider Notes (Signed)
[place] EMERGENCY DEPARTMENT AT [facility] Provider Note   CSN: [id number] Arrival date & time: 09/27/23  1640     History  Chief Complaint  Patient presents with   Shoulder Pain    [name] is a 72 y.o. male.   Shoulder Pain Patient presents with right shoulder neck pain.  Also pain down arm and weakness in his hand and wrist.  Recently seen by orthopedic surgery for the same and had MRI.  Patient does not know the results.  States she is worried it could be a stroke.  States he was told to come into the ER to rule out stroke.  Has had symptoms for weeks.  States he has cervical spine MRI done through the orthopedic office.    Past Medical History:  Diagnosis Date   Benign neoplasm of colon    Esophageal reflux    Esophagitis, unspecified    Fatty liver    Hemorrhoids    High cholesterol    Hypertension    Osteoarthrosis, unspecified whether generalized or localized, unspecified site    Other and unspecified hyperlipidemia    Skin cancer of nose    malignant skin cancer   Unspecified essential hypertension     Home Medications Prior to Admission medications   Medication Sig Start Date End Date Taking? Authorizing Provider  methylPREDNISolone (MEDROL DOSEPAK) 4 MG TBPK tablet Use per package directions 09/27/23  Yes Benjiman Core, MD  amoxicillin (AMOXIL) 875 MG tablet Take 875 mg by mouth 2 (two) times daily. Dental work 02/27/22   [provider]  aspirin 81 MG tablet Take 81 mg by mouth daily.    [provider]  Blood Glucose Monitoring Suppl DEVI 1 each by Does not apply route in the morning, at noon, and at bedtime. May substitute to any manufacturer covered by patient's insurance. 09/05/23   Octavia Heir, NP  Continuous Glucose Receiver (FREESTYLE LIBRE 2 READER) DEVI USE AS DIRECTED 09/25/23   Fargo, Amy E, NP  Continuous Glucose Sensor (FREESTYLE LIBRE 2 SENSOR) MISC 1 Application by Does not apply route every 14  (fourteen) days. 09/05/23   Fargo, Amy E, NP  Glucose Blood (BLOOD GLUCOSE TEST STRIPS) STRP 1 each by In Vitro route in the morning, at noon, and at bedtime. May substitute to any manufacturer covered by patient's insurance. 09/05/23 10/05/23  Fargo, Amy E, NP  hydrochlorothiazide (MICROZIDE) 12.5 MG capsule TAKE 1 CAPSULE BY MOUTH EVERY DAY 09/01/23   Lewayne Bunting, MD  Lancet Device MISC 1 each by Does not apply route in the morning, at noon, and at bedtime. May substitute to any manufacturer covered by patient's insurance. 09/05/23 10/05/23  Octavia Heir, NP  Lancets Misc. MISC 1 each by Does not apply route in the morning, at noon, and at bedtime. May substitute to any manufacturer covered by patient's insurance. 09/05/23 10/05/23  Fargo, Amy E, NP  lisinopril (ZESTRIL) 40 MG tablet Take 1 tablet (40 mg total) by mouth daily. 11/13/22   Lewayne Bunting, MD  loratadine (CLARITIN REDITABS) 10 MG dissolvable tablet Take 10 mg by mouth daily.    [provider]  metFORMIN (GLUCOPHAGE-XR) 500 MG 24 hr tablet Take 1 tablet (500 mg total) by mouth daily with breakfast. Patient not taking: Reported on 09/04/2023 08/07/23   Octavia Heir, NP  omeprazole (PRILOSEC) 20 MG capsule Take 1 capsule (20 mg total) by mouth daily. 06/30/23   Armbruster, Willaim Rayas, MD  rosuvastatin (CRESTOR)  40 MG tablet Take 1 tablet (40 mg total) by mouth daily. Patient not taking: Reported on 09/04/2023 08/07/23   Octavia Heir, NP      Allergies    Niacin    Review of Systems   Review of Systems  Physical Exam Updated Vital Signs BP 126/74 (BP Location: Left Arm)   Pulse (!) 48   Temp 98 F (36.7 C) (Oral)   Resp 18   SpO2 98%  Physical Exam Vitals and nursing note reviewed.  Neck:     Comments: Tenderness to neck and paraspinal musculature. Musculoskeletal:     Cervical back: Tenderness present.     Comments: Tenderness over right shoulder.  Somewhat decreased movement in shoulder.  Tenderness over scapula  on right also.  Tenderness in right axilla.  Somewhat loss of dorsiflexion at wrist.  Also somewhat loss of extension on the fingers on the right side.  Neurological:     Mental Status: He is alert.     ED Results / Procedures / Treatments   Labs (all labs ordered are listed, but only abnormal results are displayed) Labs Reviewed - No data to display  EKG None  Radiology No results found.  Procedures Procedures    Medications Ordered in ED Medications  predniSONE (DELTASONE) tablet 40 mg (40 mg Oral Given 09/27/23 2141)    ED Course/ Medical Decision Making/ A&P                                 Medical Decision Making Risk Prescription drug management.   Patient with neck pain and shoulder pain and neurologic deficits of the right hand.  Differential diagnosis along with includes cervical spine issues, peripheral nerve issues.  Has been seen by orthopedic surgery.  I reviewed orthopedic surgery notes but do not have access to their MRI that was done recently.  Discussed with Dr. Linna Caprice from [facility] who reviewed MRI findings.  We discussed them I feel this is most likely a cervical cause as opposed to cause such as stroke.  Do not think we need further imaging at this time.  Will start on steroids.  Aware of risks being diabetic but think risk outweighs benefit at this time.  Will discharge home and has short-term follow-up with the spine surgeon with [facility].        Final Clinical Impression(s) / ED Diagnoses Final diagnoses:  Cervical radiculopathy    Rx / DC Orders ED Discharge Orders          Ordered    methylPREDNISolone (MEDROL DOSEPAK) 4 MG TBPK tablet        09/27/23 2158              Benjiman Core, MD 09/28/23 1455

## 2023-10-01 ENCOUNTER — Other Ambulatory Visit: Payer: Self-pay | Admitting: Gastroenterology

## 2023-10-01 ENCOUNTER — Other Ambulatory Visit: Payer: Self-pay | Admitting: Cardiology

## 2023-10-02 DIAGNOSIS — M5412 Radiculopathy, cervical region: Secondary | ICD-10-CM | POA: Diagnosis not present

## 2023-10-02 DIAGNOSIS — M47812 Spondylosis without myelopathy or radiculopathy, cervical region: Secondary | ICD-10-CM | POA: Diagnosis not present

## 2023-10-02 DIAGNOSIS — M542 Cervicalgia: Secondary | ICD-10-CM | POA: Diagnosis not present

## 2023-10-15 DIAGNOSIS — M5412 Radiculopathy, cervical region: Secondary | ICD-10-CM | POA: Diagnosis not present

## 2023-11-04 ENCOUNTER — Ambulatory Visit: Payer: Medicare Other | Admitting: Emergency Medicine

## 2023-11-20 ENCOUNTER — Ambulatory Visit: Payer: Medicare Other | Admitting: Orthopedic Surgery

## 2023-11-20 ENCOUNTER — Encounter: Payer: Self-pay | Admitting: Orthopedic Surgery

## 2023-11-20 ENCOUNTER — Other Ambulatory Visit: Payer: Self-pay | Admitting: Orthopedic Surgery

## 2023-11-20 VITALS — BP 104/66 | HR 60 | Temp 97.4°F | Resp 17 | Ht 72.0 in | Wt 247.2 lb

## 2023-11-20 DIAGNOSIS — E782 Mixed hyperlipidemia: Secondary | ICD-10-CM

## 2023-11-20 DIAGNOSIS — Z23 Encounter for immunization: Secondary | ICD-10-CM | POA: Diagnosis not present

## 2023-11-20 DIAGNOSIS — Z0000 Encounter for general adult medical examination without abnormal findings: Secondary | ICD-10-CM | POA: Diagnosis not present

## 2023-11-20 DIAGNOSIS — Z1159 Encounter for screening for other viral diseases: Secondary | ICD-10-CM

## 2023-11-20 DIAGNOSIS — E1141 Type 2 diabetes mellitus with diabetic mononeuropathy: Secondary | ICD-10-CM

## 2023-11-20 NOTE — Progress Notes (Signed)
Subjective:   [name] is a 72 y.o. male who presents for Medicare Annual/Subsequent preventive examination.  Visit Complete: In person  Patient Medicare AWV questionnaire was completed by the patient on 11/20/2023; I have confirmed that all information answered by patient is correct and no changes since this date.  Cardiac Risk Factors include: advanced age (>73men, >34 women);diabetes mellitus;male gender;obesity (BMI >30kg/m2);sedentary lifestyle;dyslipidemia;hypertension     Objective:    Today's Vitals   11/20/23 1348 11/20/23 1405  BP: 104/66   Pulse: 60   Resp: 17   Temp: (!) 97.4 F (36.3 C)   SpO2: 96%   Weight: 247 lb 3.2 oz (112.1 kg)   Height: 6' (1.829 m)   PainSc:  6    Body mass index is 33.53 kg/m.     11/20/2023    1:52 PM 09/27/2023    4:57 PM 09/04/2023    2:04 PM 08/07/2023    1:56 PM  Advanced Directives  Does Patient Have a Medical Advance Directive? Yes No Yes Yes  Type of Estate agent of [place];Living will;Out of facility DNR (pink MOST or yellow form)  Healthcare Power of [place];Living will;Out of facility DNR (pink MOST or yellow form) Healthcare Power of [place];Living will;Out of facility DNR (pink MOST or yellow form)  Does patient want to make changes to medical advance directive? No - Patient declined  No - Patient declined No - Patient declined  Copy of Healthcare Power of Attorney in Chart? No - copy requested  No - copy requested No - copy requested  Would patient like information on creating a medical advance directive?  No - Patient declined      Current Medications (verified) Outpatient Encounter Medications as of 11/20/2023  Medication Sig   amoxicillin (AMOXIL) 875 MG tablet Take 875 mg by mouth 2 (two) times daily. Dental work   aspirin 81 MG tablet Take 81 mg by mouth daily.   Blood Glucose Monitoring Suppl DEVI 1 each by Does not apply route in the morning, at noon, and at bedtime. May  substitute to any manufacturer covered by patient's insurance.   Continuous Glucose Receiver (FREESTYLE LIBRE 2 READER) DEVI USE AS DIRECTED   Continuous Glucose Sensor (FREESTYLE LIBRE 2 SENSOR) MISC 1 Application by Does not apply route every 14 (fourteen) days.   hydrochlorothiazide (MICROZIDE) 12.5 MG capsule TAKE 1 CAPSULE BY MOUTH EVERY DAY   lisinopril (ZESTRIL) 40 MG tablet TAKE 1 TABLET BY MOUTH EVERY DAY   loratadine (CLARITIN REDITABS) 10 MG dissolvable tablet Take 10 mg by mouth daily.   methylPREDNISolone (MEDROL DOSEPAK) 4 MG TBPK tablet Use per package directions   omeprazole (PRILOSEC) 20 MG capsule TAKE 1 CAPSULE BY MOUTH EVERY DAY   [DISCONTINUED] metFORMIN (GLUCOPHAGE-XR) 500 MG 24 hr tablet Take 1 tablet (500 mg total) by mouth daily with breakfast. (Patient not taking: Reported on 09/04/2023)   [DISCONTINUED] rosuvastatin (CRESTOR) 40 MG tablet Take 1 tablet (40 mg total) by mouth daily. (Patient not taking: Reported on 09/04/2023)   No facility-administered encounter medications on file as of 11/20/2023.    Allergies (verified) Niacin   History: Past Medical History:  Diagnosis Date   Benign neoplasm of colon    Esophageal reflux    Esophagitis, unspecified    Fatty liver    Hemorrhoids    High cholesterol    Hypertension    Osteoarthrosis, unspecified whether generalized or localized, unspecified site    Other and unspecified hyperlipidemia  Skin cancer of nose    malignant skin cancer   Unspecified essential hypertension    Past Surgical History:  Procedure Laterality Date   BACK SURGERY  2002   COLONOSCOPY     ENDOSCOPIC LUMBAR DISCECTOMY W/ LASER     KNEE SURGERY     Bilateral , REPLACED   LIVER ULTRASOUND  2024   microdisctomy  12/09/2000   Lumbar spine   Open knee cartilage repair     right total knee arthroplasty     skin cancer removal     WISDOM TOOTH EXTRACTION  12/09/1973   Family History  Problem Relation Age of Onset   Hypertension  Mother    Colon cancer Mother    Diabetes Mother    Heart failure Father    Colon cancer Paternal Grandfather    Esophageal cancer Neg Hx    Rectal cancer Neg Hx    Stomach cancer Neg Hx    Social History   Socioeconomic History   Marital status: Single    Spouse name: Not on file   Number of children: Not on file   Years of education: Not on file   Highest education level: Not on file  Occupational History   Occupation: Employed    Comment: Self Employed--Financial   Tobacco Use   Smoking status: Former    Current packs/day: 0.00    Average packs/day: 2.5 packs/day for 30.0 years (75.0 ttl pk-yrs)    Types: Cigarettes    Start date: 02/12/1971    Quit date: 02/11/2001    Years since quitting: 22.7   Smokeless tobacco: Never  Vaping Use   Vaping status: Never Used  Substance and Sexual Activity   Alcohol use: Yes    Comment: OCCASIONALLY   Drug use: Never   Sexual activity: Not on file  Other Topics Concern   Not on file  Social History Narrative   Tobacco use, amount per day now: None   Past tobacco use, amount per day: 2 packs    How many years did you use tobacco: 30-35 years    Alcohol use (drinks per week): 1-2 drinks monthly    Diet:   Do you drink/eat things with caffeine: Coffee    Marital status:  Widow                                What year were you married? 1973   Do you live in a house, apartment, assisted living, condo, trailer, etc.? House   Is it one or more stories? Two   How many persons live in your home? 2   Do you have pets in your home?( please list) 5 Cats    Highest Level of education completed? High School   Current or past profession: [employer].   Do you exercise?  Yes                                Type and how often?   Do you have a living will? Yes   Do you have a DNR form? Yes                                  If not, do you want to discuss one?   Do you have signed POA/HPOA forms?  If so, please bring to  you appointment      Do you have any difficulty bathing or dressing yourself?    Do you have any difficulty preparing food or eating?   Do you have any difficulty managing your medications?   Do you have any difficulty managing your finances?   Do you have any difficulty affording your medications?    Social Drivers of Corporate investment banker Strain: Low Risk  (11/20/2023)   Overall Financial Resource Strain (CARDIA)    Difficulty of Paying Living Expenses: Not hard at all  Food Insecurity: No Food Insecurity (11/20/2023)   Hunger Vital Sign    Worried About Running Out of Food in the Last Year: Never true    Ran Out of Food in the Last Year: Never true  Transportation Needs: No Transportation Needs (11/20/2023)   PRAPARE - Administrator, Civil Service (Medical): No    Lack of Transportation (Non-Medical): No  Physical Activity: Inactive (11/20/2023)   Exercise Vital Sign    Days of Exercise per Week: 0 days    Minutes of Exercise per Session: 0 min  Stress: No Stress Concern Present (11/20/2023)   [employer] of Occupational Health - Occupational Stress Questionnaire    Feeling of Stress : Only a little  Social Connections: Moderately Integrated (11/20/2023)   Social Connection and Isolation Panel [NHANES]    Frequency of Communication with Friends and Family: More than three times a week    Frequency of Social Gatherings with Friends and Family: More than three times a week    Attends Religious Services: More than 4 times per year    Active Member of [employer] or Organizations: No    Attends Engineer, structural: Never    Marital Status: Living with partner    Tobacco Counseling Counseling given: Not Answered   Clinical Intake:  Pre-visit preparation completed: No  Pain : 0-10 Pain Score: 6  Pain Type: Chronic pain Pain Location: Neck Pain Orientation: Mid Pain Descriptors / Indicators: Sharp Pain Onset: In the past 7 days Pain  Frequency: Intermittent Pain Relieving Factors: laying down  Pain Relieving Factors: laying down  BMI - recorded: 33.53 Nutritional Status: BMI > 30  Obese Nutritional Risks: None Diabetes: No  How often do you need to have someone help you when you read instructions, pamphlets, or other written materials from your doctor or pharmacy?: 1 - Never What is the last grade level you completed in school?: high school  Interpreter Needed?: No      Activities of Daily Living    11/20/2023    2:10 PM  In your present state of health, do you have any difficulty performing the following activities:  Hearing? 0  Vision? 0  Difficulty concentrating or making decisions? 0  Walking or climbing stairs? 0  Dressing or bathing? 0  Doing errands, shopping? 0  Preparing Food and eating ? N  Using the Toilet? N  In the past six months, have you accidently leaked urine? Y  Do you have problems with loss of bowel control? N  Managing your Medications? N  Managing your Finances? N  Housekeeping or managing your Housekeeping? N    Patient Care Team: Octavia Heir, NP as PCP - General (Adult Health Nurse Practitioner) Armbruster, Willaim Rayas, MD as Consulting Physician (Gastroenterology) Gaylord Shih, [facility] (Specialist) Lewayne Bunting, MD as Consulting Physician (Cardiology)  Indicate any recent Medical Services you may have received from other than  [facility] providers in the past year (date may be approximate).     Assessment:   This is a routine wellness examination for [name].  Hearing/Vision screen Hearing Screening - Comments:: No hearing concerns.  Vision Screening - Comments:: No vision concerns. Patient doesn't wear prescription glasses.    Goals Addressed             This Visit's Progress    Weight (lb) < 200 lb (90.7 kg)   247 lb 3.2 oz (112.1 kg)      Depression Screen    11/20/2023    1:52 PM  PHQ 2/9 Scores  PHQ - 2 Score 0    Fall Risk    11/20/2023    1:51 PM  09/04/2023    2:04 PM 08/07/2023    1:56 PM  Fall Risk   Falls in the past year? 0 0 0  Number falls in past yr: 0 0 0  Injury with Fall? 0 0 0  Risk for fall due to : No Fall Risks No Fall Risks No Fall Risks  Follow up Falls evaluation completed;Education provided;Falls prevention discussed Falls evaluation completed;Education provided;Falls prevention discussed Falls evaluation completed;Education provided;Falls prevention discussed    MEDICARE RISK AT HOME: Medicare Risk at Home Any stairs in or around the home?: Yes If so, are there any without handrails?: Yes Home free of loose throw rugs in walkways, pet beds, electrical cords, etc?: Yes Adequate lighting in your home to reduce risk of falls?: Yes Life alert?: No Use of a cane, walker or w/c?: No Grab bars in the bathroom?: No Shower chair or bench in shower?: Yes Elevated toilet seat or a handicapped toilet?: Yes  TIMED UP AND GO:  Was the test performed?  No    Cognitive Function:    11/20/2023    1:53 PM  MMSE - Mini Mental State Exam  Orientation to time 5  Orientation to Place 5  Registration 3  Attention/ Calculation 5  Recall 3  Language- name 2 objects 2  Language- repeat 1  Language- follow 3 step command 2  Language- follow 3 step command-comments patient grabbed paper with left hand instead of right hand as instructed.  Language- read & follow direction 1  Write a sentence 1  Copy design 1  Total score 29        Immunizations Immunization History  Administered Date(s) Administered   PFIZER(Purple Top)SARS-COV-2 Vaccination 01/06/2020, 02/03/2020    TDAP status: Up to date  Flu Vaccine status: Declined, Education has been provided regarding the importance of this vaccine but patient still declined. Advised may receive this vaccine at local pharmacy or Health Dept. Aware to provide a copy of the vaccination record if obtained from local pharmacy or Health Dept. Verbalized acceptance and  understanding.  Pneumococcal vaccine status: Due, Education has been provided regarding the importance of this vaccine. Advised may receive this vaccine at local pharmacy or Health Dept. Aware to provide a copy of the vaccination record if obtained from local pharmacy or Health Dept. Verbalized acceptance and understanding.  Covid-19 vaccine status: Completed vaccines  Qualifies for Shingles Vaccine? Yes   Zostavax completed No   Shingrix Completed?: No.    Education has been provided regarding the importance of this vaccine. Patient has been advised to call insurance company to determine out of pocket expense if they have not yet received this vaccine. Advised may also receive vaccine at local pharmacy or Health Dept. Verbalized acceptance and understanding.  Screening Tests  Health Maintenance  Topic Date Due   Pneumonia Vaccine 63+ Years old (1 of 2 - PCV) Never done   Hepatitis C Screening  Never done   DTaP/Tdap/Td (1 - Tdap) Never done   Zoster Vaccines- Shingrix (1 of 2) Never done   COVID-19 Vaccine (3 - Pfizer risk series) 03/02/2020   INFLUENZA VACCINE  Never done   Diabetic kidney evaluation - eGFR measurement  08/03/2024   Diabetic kidney evaluation - Urine ACR  09/03/2024   Medicare Annual Wellness (AWV)  11/19/2024   Colonoscopy  06/24/2028   HPV VACCINES  Aged Out    Health Maintenance  Health Maintenance Due  Topic Date Due   Pneumonia Vaccine 63+ Years old (1 of 2 - PCV) Never done   Hepatitis C Screening  Never done   DTaP/Tdap/Td (1 - Tdap) Never done   Zoster Vaccines- Shingrix (1 of 2) Never done   COVID-19 Vaccine (3 - Pfizer risk series) 03/02/2020   INFLUENZA VACCINE  Never done    Colorectal cancer screening: Type of screening: Colonoscopy. Completed 06/25/2023. Repeat every 3-5 years  Lung Cancer Screening: (Low Dose CT Chest recommended if Age 44-80 years, 20 pack-year currently smoking OR have quit w/in 15years.) does not qualify.   Lung Cancer  Screening Referral: No  Additional Screening:  Hepatitis C Screening: does not qualify; Completed   Vision Screening: Recommended annual ophthalmology exams for early detection of glaucoma and other disorders of the eye. Is the patient up to date with their annual eye exam?  Yes  Who is the provider or what is the name of the office in which the patient attends annual eye exams? [facility] If pt is not established with a provider, would they like to be referred to a provider to establish care? No .   Dental Screening: Recommended annual dental exams for proper oral hygiene  Diabetic Foot Exam: Diabetic Foot Exam: Completed 11/20/2023  Community Resource Referral / Chronic Care Management: CRR required this visit?  No   CCM required this visit?  No     Plan:     I have personally reviewed and noted the following in the patient's chart:   Medical and social history Use of alcohol, tobacco or illicit drugs  Current medications and supplements including opioid prescriptions. Patient is not currently taking opioid prescriptions. Functional ability and status Nutritional status Physical activity Advanced directives List of other physicians Hospitalizations, surgeries, and ER visits in previous 12 months Vitals Screenings to include cognitive, depression, and falls Referrals and appointments  In addition, I have reviewed and discussed with patient certain preventive protocols, quality metrics, and best practice recommendations. A written personalized care plan for preventive services as well as general preventive health recommendations were provided to patient.     Octavia Heir, NP   11/20/2023   After Visit Summary: (MyChart) Due to this being a telephonic visit, the after visit summary with patients personalized plan was offered to patient via MyChart   Nurse Notes: MMSE 29/30. Prevnar 20 given.

## 2023-11-20 NOTE — Patient Instructions (Signed)
  [name] , Thank you for taking time to come for your Medicare Wellness Visit. I appreciate your ongoing commitment to your health goals. Please review the following plan we discussed and let me know if I can assist you in the future.   These are the goals we discussed:  Goals      Weight (lb) < 200 lb (90.7 kg)        This is a list of the screening recommended for you and due dates:  Health Maintenance  Topic Date Due   Hepatitis C Screening  Never done   DTaP/Tdap/Td vaccine (1 - Tdap) Never done   Zoster (Shingles) Vaccine (1 of 2) Never done   COVID-19 Vaccine (3 - Pfizer risk series) 03/02/2020   Flu Shot  Never done   Yearly kidney function blood test for diabetes  08/03/2024   Yearly kidney health urinalysis for diabetes  09/03/2024   Medicare Annual Wellness Visit  11/19/2024   Colon Cancer Screening  06/24/2028   Pneumonia Vaccine  Completed   HPV Vaccine  Aged Out   Please get Tdap (tetanus) and shingrix vaccines.

## 2023-11-21 ENCOUNTER — Other Ambulatory Visit: Payer: Medicare Other

## 2023-11-21 DIAGNOSIS — E1141 Type 2 diabetes mellitus with diabetic mononeuropathy: Secondary | ICD-10-CM

## 2023-11-21 DIAGNOSIS — Z1159 Encounter for screening for other viral diseases: Secondary | ICD-10-CM

## 2023-11-21 DIAGNOSIS — E782 Mixed hyperlipidemia: Secondary | ICD-10-CM | POA: Diagnosis not present

## 2023-11-22 LAB — LIPID PANEL
Cholesterol: 148 mg/dL (ref <200)
HDL: 27 mg/dL — ABNORMAL LOW (ref >=40)
LDL Cholesterol (Calc): 96 mg/dL
Non-HDL Cholesterol (Calc): 121 mg/dL (ref <130)
Total CHOL/HDL Ratio: 5.5 (calc) — ABNORMAL HIGH (ref <5.0)
Triglycerides: 149 mg/dL (ref <150)

## 2023-11-22 LAB — HEMOGLOBIN A1C
Hgb A1c MFr Bld: 6.1 %{Hb} — ABNORMAL HIGH (ref <5.7)
Mean Plasma Glucose: 128 mg/dL
eAG (mmol/L): 7.1 mmol/L

## 2023-11-22 LAB — HEPATITIS C ANTIBODY: Hepatitis C Ab: NONREACTIVE

## 2023-11-25 DIAGNOSIS — H25013 Cortical age-related cataract, bilateral: Secondary | ICD-10-CM | POA: Diagnosis not present

## 2023-11-25 DIAGNOSIS — H40033 Anatomical narrow angle, bilateral: Secondary | ICD-10-CM | POA: Diagnosis not present

## 2023-11-25 DIAGNOSIS — H2513 Age-related nuclear cataract, bilateral: Secondary | ICD-10-CM | POA: Diagnosis not present

## 2023-11-25 DIAGNOSIS — H0220C Unspecified lagophthalmos, bilateral, upper and lower eyelids: Secondary | ICD-10-CM | POA: Diagnosis not present

## 2023-11-25 DIAGNOSIS — H52203 Unspecified astigmatism, bilateral: Secondary | ICD-10-CM | POA: Diagnosis not present

## 2023-11-25 DIAGNOSIS — H524 Presbyopia: Secondary | ICD-10-CM | POA: Diagnosis not present

## 2023-11-25 DIAGNOSIS — H5201 Hypermetropia, right eye: Secondary | ICD-10-CM | POA: Diagnosis not present

## 2023-11-26 DIAGNOSIS — H40032 Anatomical narrow angle, left eye: Secondary | ICD-10-CM | POA: Diagnosis not present

## 2023-11-27 ENCOUNTER — Encounter: Payer: Self-pay | Admitting: Orthopedic Surgery

## 2023-11-27 ENCOUNTER — Ambulatory Visit (INDEPENDENT_AMBULATORY_CARE_PROVIDER_SITE_OTHER): Payer: Medicare Other | Admitting: Orthopedic Surgery

## 2023-11-27 ENCOUNTER — Telehealth: Payer: Self-pay | Admitting: *Deleted

## 2023-11-27 ENCOUNTER — Other Ambulatory Visit: Payer: Self-pay | Admitting: Cardiology

## 2023-11-27 VITALS — BP 124/62 | HR 56 | Temp 97.1°F | Resp 17 | Ht 72.0 in | Wt 242.8 lb

## 2023-11-27 DIAGNOSIS — E1141 Type 2 diabetes mellitus with diabetic mononeuropathy: Secondary | ICD-10-CM | POA: Diagnosis not present

## 2023-11-27 DIAGNOSIS — Z6832 Body mass index (BMI) 32.0-32.9, adult: Secondary | ICD-10-CM

## 2023-11-27 DIAGNOSIS — I495 Sick sinus syndrome: Secondary | ICD-10-CM | POA: Diagnosis not present

## 2023-11-27 DIAGNOSIS — M5412 Radiculopathy, cervical region: Secondary | ICD-10-CM

## 2023-11-27 DIAGNOSIS — I10 Essential (primary) hypertension: Secondary | ICD-10-CM | POA: Diagnosis not present

## 2023-11-27 DIAGNOSIS — E66811 Obesity, class 1: Secondary | ICD-10-CM

## 2023-11-27 DIAGNOSIS — E6609 Other obesity due to excess calories: Secondary | ICD-10-CM

## 2023-11-27 NOTE — Telephone Encounter (Signed)
-----   Message from Olga Millers sent at 11/22/2023 12:56 AM EST ----- LDL not at goal; continue crestor 40 mg daily; add zetia 10 mg daily; lipids and liver 8 weeks Olga Millers ----- Message ----- From: Janace Hoard Lab Results In Sent: 11/22/2023  12:19 AM EST To: Lewayne Bunting, MD

## 2023-11-27 NOTE — Telephone Encounter (Signed)
Left message for pt to call.

## 2023-11-27 NOTE — Progress Notes (Signed)
Careteam: Patient Care Team: Octavia Heir, NP as PCP - General (Adult Health Nurse Practitioner) Adela Lank, Willaim Rayas, MD as Consulting Physician (Gastroenterology) [facility], [facility] (Specialist) Lewayne Bunting, MD as Consulting Physician (Cardiology)  Seen by: Hazle Nordmann, AGNP-C  PLACE OF SERVICE:  [facility]  Advanced Directive information Does Patient Have a Medical Advance Directive?: Yes, Type of Advance Directive: Healthcare Power of [place];Living will;Out of facility DNR (pink MOST or yellow form), Does patient want to make changes to medical advance directive?: No - Patient declined  Allergies  Allergen Reactions   Niacin Other (See Comments)    REACTION: flushing REACTION: flushing    Chief Complaint  Patient presents with   Medical Management of Chronic Issues    3 month follow up.    Immunizations    Discuss the need for DTAP vaccine, and Shingrix vaccine.      HPI: Patient is a 72 y.o. male seen today for medical management of chronic conditions.   A1c 6.1> was 6.4. He has lost > 20 lbs within past 4 months. He is checking blood sugars at least daily. No recent hypoglycemia. Not on medication. Following low carb/sugar diet.   Recent lipid panel with total cholesterol 148, LDL 96. Missed call from cardiology today. Cardiology recommending Crestor 40 mg daily and adding Zetia 10 mg daily. He has not been taking statin medication for a few months.   Continues to have neck and right arm pain. Followed by Dr. [name] and Dr. Drucie Ip in past. Notes suggest cervical radiculopathy and spondylosis. He does not take anything for pain.   HR 50, rechecked apical 56. H/o sinus bradycardia.   BP 124/62, remains on lisinopril and hydrochlorothiazide.   Review of Systems:  Review of Systems  Constitutional: Negative.   HENT: Negative.    Eyes: Negative.   Respiratory: Negative.    Cardiovascular: Negative.   Gastrointestinal: Negative.   Genitourinary: Negative.    Musculoskeletal:  Positive for back pain and neck pain. Negative for falls.  Skin: Negative.   Neurological: Negative.   Psychiatric/Behavioral: Negative.      Past Medical History:  Diagnosis Date   Benign neoplasm of colon    Esophageal reflux    Esophagitis, unspecified    Fatty liver    Hemorrhoids    High cholesterol    Hypertension    Osteoarthrosis, unspecified whether generalized or localized, unspecified site    Other and unspecified hyperlipidemia    Skin cancer of nose    malignant skin cancer   Unspecified essential hypertension    Past Surgical History:  Procedure Laterality Date   BACK SURGERY  2002   COLONOSCOPY     ENDOSCOPIC LUMBAR DISCECTOMY W/ LASER     KNEE SURGERY     Bilateral , REPLACED   LIVER ULTRASOUND  2024   microdisctomy  12/09/2000   Lumbar spine   Open knee cartilage repair     right total knee arthroplasty     skin cancer removal     WISDOM TOOTH EXTRACTION  12/09/1973   Social History:   reports that he quit smoking about 22 years ago. His smoking use included cigarettes. He started smoking about 52 years ago. He has a 75 pack-year smoking history. He has never used smokeless tobacco. He reports current alcohol use. He reports that he does not use drugs.  Family History  Problem Relation Age of Onset   Hypertension Mother    Colon cancer Mother  Diabetes Mother    Heart failure Father    Colon cancer Paternal Grandfather    Esophageal cancer Neg Hx    Rectal cancer Neg Hx    Stomach cancer Neg Hx     Medications: Patient's Medications  New Prescriptions   No medications on file  Previous Medications   AMOXICILLIN (AMOXIL) 875 MG TABLET    Take 875 mg by mouth 2 (two) times daily. Dental work   ASPIRIN 81 MG TABLET    Take 81 mg by mouth daily.   BLOOD GLUCOSE MONITORING SUPPL DEVI    1 each by Does not apply route in the morning, at noon, and at bedtime. May substitute to any manufacturer covered by patient's insurance.    CLOBETASOL CREAM (TEMOVATE) 0.05 %    Apply 1 Application topically 2 (two) times daily.   CLOTRIMAZOLE-BETAMETHASONE (LOTRISONE) CREAM    Apply 1 Application topically 2 (two) times daily.   CONTINUOUS GLUCOSE RECEIVER (FREESTYLE LIBRE 2 READER) DEVI    USE AS DIRECTED   CONTINUOUS GLUCOSE SENSOR (FREESTYLE LIBRE 2 SENSOR) MISC    1 Application by Does not apply route every 14 (fourteen) days.   HYDROCHLOROTHIAZIDE (MICROZIDE) 12.5 MG CAPSULE    TAKE 1 CAPSULE BY MOUTH EVERY DAY   LISINOPRIL (ZESTRIL) 40 MG TABLET    TAKE 1 TABLET BY MOUTH EVERY DAY   LORATADINE (CLARITIN REDITABS) 10 MG DISSOLVABLE TABLET    Take 10 mg by mouth daily.   METHYLPREDNISOLONE (MEDROL DOSEPAK) 4 MG TBPK TABLET    Use per package directions   OMEPRAZOLE (PRILOSEC) 20 MG CAPSULE    TAKE 1 CAPSULE BY MOUTH EVERY DAY  Modified Medications   No medications on file  Discontinued Medications   No medications on file    Physical Exam:  Vitals:   11/27/23 0933  BP: 124/62  Pulse: (!) 50  Resp: 17  Temp: (!) 97.1 F (36.2 C)  SpO2: 98%  Weight: 242 lb 12.8 oz (110.1 kg)  Height: 6' (1.829 m)   Body mass index is 32.93 kg/m. Wt Readings from Last 3 Encounters:  11/27/23 242 lb 12.8 oz (110.1 kg)  11/20/23 247 lb 3.2 oz (112.1 kg)  09/04/23 260 lb (117.9 kg)    Physical Exam Vitals reviewed.  Constitutional:      General: He is not in acute distress.    Appearance: He is obese.  HENT:     Head: Normocephalic.  Eyes:     General:        Right eye: No discharge.        Left eye: No discharge.  Cardiovascular:     Rate and Rhythm: Regular rhythm. Bradycardia present.     Pulses: Normal pulses.     Heart sounds: Normal heart sounds.  Pulmonary:     Effort: No respiratory distress.     Breath sounds: Normal breath sounds. No wheezing.  Abdominal:     General: Bowel sounds are normal.     Palpations: Abdomen is soft.  Musculoskeletal:     Cervical back: Neck supple.     Right lower leg: No  edema.     Left lower leg: No edema.  Skin:    General: Skin is warm.     Capillary Refill: Capillary refill takes less than 2 seconds.  Neurological:     General: No focal deficit present.     Mental Status: He is alert and oriented to person, place, and time.  Psychiatric:  Mood and Affect: Mood normal.     Labs reviewed: Basic Metabolic Panel: Recent Labs    06/13/23 1124 08/04/23 0833  NA 139 143  K 3.5 5.0  CL 101 103  CO2 29 26  GLUCOSE 122* 106*  BUN 21 16  CREATININE 1.22 1.01  CALCIUM 9.8 9.7  TSH  --  1.630   Liver Function Tests: Recent Labs    06/13/23 1124 08/04/23 0833  AST 16 18  ALT 17 23  ALKPHOS 77 92  BILITOT 0.6 0.4  PROT 7.4 7.0  ALBUMIN 4.2 4.2   No results for input(s): "LIPASE", "AMYLASE" in the last 8760 hours. No results for input(s): "AMMONIA" in the last 8760 hours. CBC: Recent Labs    06/13/23 1124 08/04/23 0833  WBC 7.6 7.9  NEUTROABS 4.2  --   HGB 15.2 15.2  HCT 45.5 46.1  MCV 91.4 92  PLT 243.0 232   Lipid Panel: Recent Labs    08/04/23 0833 11/21/23 0922  CHOL 178 148  HDL 28* 27*  LDLCALC 95 96  TRIG 328* 149  CHOLHDL 6.4* 5.5*   TSH: Recent Labs    08/04/23 0833  TSH 1.630   A1C: Lab Results  Component Value Date   HGBA1C 6.1 (H) 11/21/2023     Assessment/Plan 1. Type 2 diabetes mellitus with diabetic mononeuropathy, without long-term current use of insulin (HCC) (Primary) - improving with weight loss - A1c now 6.1> was 6.4 - urine microalbumin normal - diet controlled - no hypoglycemia - cont weight loss efforts and low carb diet  2. Essential hypertension - controlled with lisinopril and HCTZ  3. SINUS BRADYCARDIA - initial HR 50, apical 56 - asymptomatic  4. Class 1 obesity due to excess calories with serious comorbidity and body mass index (BMI) of 32.0 to 32.9 in adult - improved - BMI 32.93> was 36 - lost > 20 lbs within 4 months  - cont to limit calories and increase  exercise efforts  5. Cervical radiculopathy - followed by Dr. [name] and Dr. Drucie Ip  - pain/numbness radiates down right arm to fingers - not on medication   Total time: 32 minutes. Greater than 50% of total time spent doing patient education regarding health maintenance, T2DM, HTN, HLD and obesity including symptom/medication management.    Next appt: 03/04/2024  Hazle Nordmann, Juel Burrow  [facility] [phone]

## 2023-11-27 NOTE — Patient Instructions (Addendum)
Please call cardiology and let them know you are not taking Crestor  [facility]  Continue weight loss efforts  150 minutes/week of activity   Follow low carb and sugar diet

## 2023-12-01 DIAGNOSIS — H40031 Anatomical narrow angle, right eye: Secondary | ICD-10-CM | POA: Diagnosis not present

## 2023-12-05 ENCOUNTER — Encounter: Payer: Self-pay | Admitting: *Deleted

## 2023-12-05 NOTE — Telephone Encounter (Signed)
Letter mailed to patient asking him to call to discuss.

## 2023-12-22 ENCOUNTER — Encounter: Payer: Self-pay | Admitting: Orthopedic Surgery

## 2024-02-04 ENCOUNTER — Other Ambulatory Visit: Payer: Self-pay | Admitting: Orthopedic Surgery

## 2024-02-04 DIAGNOSIS — E1141 Type 2 diabetes mellitus with diabetic mononeuropathy: Secondary | ICD-10-CM

## 2024-02-18 ENCOUNTER — Other Ambulatory Visit: Payer: Self-pay | Admitting: Orthopedic Surgery

## 2024-02-18 DIAGNOSIS — E1141 Type 2 diabetes mellitus with diabetic mononeuropathy: Secondary | ICD-10-CM

## 2024-03-02 ENCOUNTER — Other Ambulatory Visit: Payer: Medicare Other

## 2024-03-02 DIAGNOSIS — E1141 Type 2 diabetes mellitus with diabetic mononeuropathy: Secondary | ICD-10-CM

## 2024-03-04 ENCOUNTER — Ambulatory Visit: Payer: Medicare Other | Admitting: Orthopedic Surgery

## 2024-03-08 ENCOUNTER — Other Ambulatory Visit

## 2024-03-08 DIAGNOSIS — E1141 Type 2 diabetes mellitus with diabetic mononeuropathy: Secondary | ICD-10-CM | POA: Diagnosis not present

## 2024-03-09 LAB — BASIC METABOLIC PANEL WITH GFR
BUN: 18 mg/dL (ref 7–25)
CO2: 29 mmol/L (ref 20–32)
Calcium: 9.8 mg/dL (ref 8.6–10.3)
Chloride: 102 mmol/L (ref 98–110)
Creat: 1.07 mg/dL (ref 0.70–1.28)
Glucose, Bld: 106 mg/dL — ABNORMAL HIGH (ref 65–99)
Potassium: 4.8 mmol/L (ref 3.5–5.3)
Sodium: 139 mmol/L (ref 135–146)
eGFR: 73 mL/min/{1.73_m2} (ref >=60)

## 2024-03-09 LAB — HEMOGLOBIN A1C
Hgb A1c MFr Bld: 5.9 %{Hb} — ABNORMAL HIGH (ref <5.7)
Mean Plasma Glucose: 123 mg/dL
eAG (mmol/L): 6.8 mmol/L

## 2024-03-10 ENCOUNTER — Encounter: Payer: Self-pay | Admitting: Orthopedic Surgery

## 2024-03-11 ENCOUNTER — Ambulatory Visit (INDEPENDENT_AMBULATORY_CARE_PROVIDER_SITE_OTHER): Admitting: Orthopedic Surgery

## 2024-03-11 ENCOUNTER — Encounter: Payer: Self-pay | Admitting: Orthopedic Surgery

## 2024-03-11 VITALS — BP 116/72 | HR 56 | Temp 97.0°F | Resp 17 | Ht 72.0 in | Wt 228.0 lb

## 2024-03-11 DIAGNOSIS — E66811 Obesity, class 1: Secondary | ICD-10-CM

## 2024-03-11 DIAGNOSIS — E782 Mixed hyperlipidemia: Secondary | ICD-10-CM

## 2024-03-11 DIAGNOSIS — R7303 Prediabetes: Secondary | ICD-10-CM | POA: Diagnosis not present

## 2024-03-11 DIAGNOSIS — K219 Gastro-esophageal reflux disease without esophagitis: Secondary | ICD-10-CM | POA: Diagnosis not present

## 2024-03-11 DIAGNOSIS — I10 Essential (primary) hypertension: Secondary | ICD-10-CM | POA: Diagnosis not present

## 2024-03-11 DIAGNOSIS — R001 Bradycardia, unspecified: Secondary | ICD-10-CM | POA: Diagnosis not present

## 2024-03-11 DIAGNOSIS — E6609 Other obesity due to excess calories: Secondary | ICD-10-CM

## 2024-03-11 DIAGNOSIS — Z6832 Body mass index (BMI) 32.0-32.9, adult: Secondary | ICD-10-CM

## 2024-03-11 NOTE — Patient Instructions (Addendum)
 Please get Shingles and tetanus vaccine at local pharmacy  Please schedule lab visit prior to next scheduled visit > fasting labs next time  Please schedule yearly visit with Dr. Jens Som

## 2024-03-11 NOTE — Progress Notes (Signed)
 Careteam: Patient Care Team: [name] Heir, NP as PCP - General (Adult Health Nurse Practitioner) Adela Lank, Willaim Rayas, MD as Consulting Physician (Gastroenterology) Ortho, [facility] (Specialist) Lewayne Bunting, MD as Consulting Physician (Cardiology)  Seen by: Hazle Nordmann, AGNP-C  PLACE OF SERVICE:  [facility]  Advanced Directive information Does Patient Have a Medical Advance Directive?: Yes, Type of Advance Directive: Healthcare Power of [place];Living will;Out of facility DNR (pink MOST or yellow form), Does patient want to make changes to medical advance directive?: No - Patient declined  Allergies  Allergen Reactions   Niacin Other (See Comments)    REACTION: flushing REACTION: flushing    Chief Complaint  Patient presents with   Medical Management of Chronic Issues    3 month follow up. Discus the need for [employer], DTAP vaccine, and Shingrix vaccine.      HPI: Patient is a 73 y.o. male seen today for medical management of chronic conditions.   Discussed the use of AI scribe software for clinical note transcription with the patient, who gave verbal consent to proceed.  Recent A1c 5.9> was 6.4. He has not been taking metformin as recommended. He has not been regularly checking his blood sugars at home recently. He has lost over 20 lbs since diabetes diagnosis.  Weight loss was accomplished with dietary changes. He stopped drinking ginger ale and is limiting breads and sweets. He has lost approximately 19 pounds since December, going from 247 pounds to 228 pounds. He has not yet incorporated exercise into his routine.  He is taking hydrochlorothiazide and lisinopril for blood pressure management, which is currently well-controlled. He also takes omeprazole for indigestion or heartburn, although he is unsure if he still needs it given his dietary changes. He has been on omeprazole for years but is considering stopping it to avoid long-term side effects.  He is also  considering getting a shingles vaccine and a tetanus booster, as he has not yet received these vaccinations.   Plan to have his cholesterol checked at his next appointment.       Review of Systems:  Review of Systems  Constitutional: Negative.   HENT: Negative.    Eyes: Negative.   Respiratory: Negative.    Cardiovascular: Negative.   Gastrointestinal: Negative.   Genitourinary: Negative.   Musculoskeletal: Negative.   Skin: Negative.   Neurological: Negative.   Psychiatric/Behavioral: Negative.      Past Medical History:  Diagnosis Date   Benign neoplasm of colon    Esophageal reflux    Esophagitis, unspecified    Fatty liver    Hemorrhoids    High cholesterol    Hypertension    Osteoarthrosis, unspecified whether generalized or localized, unspecified site    Other and unspecified hyperlipidemia    Skin cancer of nose    malignant skin cancer   Unspecified essential hypertension    Past Surgical History:  Procedure Laterality Date   BACK SURGERY  2002   COLONOSCOPY     ENDOSCOPIC LUMBAR DISCECTOMY W/ LASER     KNEE SURGERY     Bilateral , REPLACED   LIVER ULTRASOUND  2024   microdisctomy  12/09/2000   Lumbar spine   Open knee cartilage repair     right total knee arthroplasty     skin cancer removal     WISDOM TOOTH EXTRACTION  12/09/1973   Social History:   reports that he quit smoking about 23 years ago. His smoking use included cigarettes. He started smoking  about 53 years ago. He has a 75 pack-year smoking history. He has never used smokeless tobacco. He reports current alcohol use. He reports that he does not use drugs.  Family History  Problem Relation Age of Onset   Hypertension Mother    Colon cancer Mother    Diabetes Mother    Heart failure Father    Colon cancer Paternal Grandfather    Esophageal cancer Neg Hx    Rectal cancer Neg Hx    Stomach cancer Neg Hx     Medications: Patient's Medications  New Prescriptions   No medications on  file  Previous Medications   AMOXICILLIN (AMOXIL) 875 MG TABLET    Take 875 mg by mouth 2 (two) times daily. Dental work   ASPIRIN 81 MG TABLET    Take 81 mg by mouth daily.   BLOOD GLUCOSE MONITORING SUPPL DEVI    1 each by Does not apply route in the morning, at noon, and at bedtime. May substitute to any manufacturer covered by patient's insurance.   CLOBETASOL CREAM (TEMOVATE) 0.05 %    Apply 1 Application topically 2 (two) times daily.   CLOTRIMAZOLE-BETAMETHASONE (LOTRISONE) CREAM    Apply 1 Application topically 2 (two) times daily.   CONTINUOUS GLUCOSE RECEIVER (FREESTYLE LIBRE 2 READER) DEVI    USE AS DIRECTED   CONTINUOUS GLUCOSE SENSOR (FREESTYLE LIBRE 2 SENSOR) MISC    1 Application by Does not apply route every 14 (fourteen) days.   HYDROCHLOROTHIAZIDE (MICROZIDE) 12.5 MG CAPSULE    TAKE 1 CAPSULE BY MOUTH EVERY DAY   LISINOPRIL (ZESTRIL) 40 MG TABLET    TAKE 1 TABLET BY MOUTH EVERY DAY   LORATADINE (CLARITIN REDITABS) 10 MG DISSOLVABLE TABLET    Take 10 mg by mouth daily.   METFORMIN (GLUCOPHAGE-XR) 500 MG 24 HR TABLET    TAKE 1 TABLET BY MOUTH EVERY DAY WITH BREAKFAST   METHYLPREDNISOLONE (MEDROL DOSEPAK) 4 MG TBPK TABLET    Use per package directions   OMEPRAZOLE (PRILOSEC) 20 MG CAPSULE    TAKE 1 CAPSULE BY MOUTH EVERY DAY  Modified Medications   No medications on file  Discontinued Medications   No medications on file    Physical Exam:  Vitals:   03/11/24 0930  BP: 116/72  Pulse: (!) 46  Resp: 17  Temp: (!) 97 F (36.1 C)  SpO2: 96%  Weight: 228 lb (103.4 kg)  Height: 6' (1.829 m)   Body mass index is 30.92 kg/m. Wt Readings from Last 3 Encounters:  03/11/24 228 lb (103.4 kg)  11/27/23 242 lb 12.8 oz (110.1 kg)  11/20/23 247 lb 3.2 oz (112.1 kg)    Physical Exam Vitals reviewed.  Constitutional:      General: He is not in acute distress. HENT:     Head: Normocephalic.  Eyes:     General:        Right eye: No discharge.        Left eye: No  discharge.  Cardiovascular:     Rate and Rhythm: Normal rate and regular rhythm.     Pulses: Normal pulses.     Heart sounds: Normal heart sounds.  Pulmonary:     Effort: Pulmonary effort is normal.     Breath sounds: Normal breath sounds.  Abdominal:     General: Bowel sounds are normal.     Palpations: Abdomen is soft.  Musculoskeletal:     Cervical back: Neck supple.  Neurological:     Mental Status: He  is alert.     Labs reviewed: Basic Metabolic Panel: Recent Labs    06/13/23 1124 08/04/23 0833 03/08/24 0843  NA 139 143 139  K 3.5 5.0 4.8  CL 101 103 102  CO2 29 26 29   GLUCOSE 122* 106* 106*  BUN 21 16 18   CREATININE 1.22 1.01 1.07  CALCIUM 9.8 9.7 9.8  TSH  --  1.630  --    Liver Function Tests: Recent Labs    06/13/23 1124 08/04/23 0833  AST 16 18  ALT 17 23  ALKPHOS 77 92  BILITOT 0.6 0.4  PROT 7.4 7.0  ALBUMIN 4.2 4.2   No results for input(s): "LIPASE", "AMYLASE" in the last 8760 hours. No results for input(s): "AMMONIA" in the last 8760 hours. CBC: Recent Labs    06/13/23 1124 08/04/23 0833  WBC 7.6 7.9  NEUTROABS 4.2  --   HGB 15.2 15.2  HCT 45.5 46.1  MCV 91.4 92  PLT 243.0 232   Lipid Panel: Recent Labs    08/04/23 0833 11/21/23 0922  CHOL 178 148  HDL 28* 27*  LDLCALC 95 96  TRIG 328* 149  CHOLHDL 6.4* 5.5*   TSH: Recent Labs    08/04/23 0833  TSH 1.630   A1C: Lab Results  Component Value Date   HGBA1C 5.9 (H) 03/08/2024     Assessment/Plan 1. Prediabetes (Primary) - Improved - recent A1c 5.9> was 6.4 - never started metformin as advised - not checking blood sugars at this time - A1c improved with weight loss - EYE exam 12/08/2023> [facility]  - cont weight loss efforts and diabetic diet - cont A1c and ACE - Hemoglobin A1c; Future - Microalbumin/Creatinine Ratio, Urine; Future  2. Essential hypertension - controlled with hydrochlorothiazide and lisinopril - CBC with Differential/Platelet;  Future - Complete Metabolic Panel with eGFR; Future  3. Gastroesophageal reflux disease without esophagitis - discussed drug holiday for effectiveness - eating healthier foods due to diabetes - cont omerpazole  4. Mixed hyperlipidemia - total 148, LDL 96 11/21/2023 - Lipid Panel; Future  5. Class 1 obesity due to excess calories with serious comorbidity and body mass index (BMI) of 32.0 to 32.9 in adult - improving - BMI 30.92 - cont to limit calories to < 1800/day - recommend exercise 150 minutes/week  6. Bradycardia - apical HR 56 - 07/2023 EKG with sinus rhythm with PVC's - asymptomatic - followed by cardiology  Total time: 31 minutes. Greater than 50% of total time spent doing patient education regarding health maintenance, prediabetes, HTN, HLD, obesity and GERD including symptom/medication management.   Next appt: Renelle Stegenga Norval Gable, NP  Julio Zappia Scherry Ran  [facility] [phone]

## 2024-06-09 ENCOUNTER — Other Ambulatory Visit: Payer: Self-pay | Admitting: Gastroenterology

## 2024-06-16 ENCOUNTER — Telehealth: Payer: Self-pay

## 2024-06-16 DIAGNOSIS — K760 Fatty (change of) liver, not elsewhere classified: Secondary | ICD-10-CM

## 2024-06-16 NOTE — Telephone Encounter (Signed)
 Order placed for LFTs. Called and Left detailed message for patient to go to the lab.  He is due for yearly follow up appointment.  Scheduled him for 9-9 at 10:30am.  Asked him to call back to reschedule if that is not convenient.

## 2024-06-16 NOTE — Telephone Encounter (Signed)
-----   Message from [facility] sent at 06/25/2023 11:10 AM EDT ----- Regarding: due for LFTs Due for LFTs in 1 yr = 06-23-23 (also should have OV scheduled for July 2025 for fatty liver)

## 2024-07-09 ENCOUNTER — Other Ambulatory Visit

## 2024-07-09 DIAGNOSIS — R7303 Prediabetes: Secondary | ICD-10-CM

## 2024-07-09 DIAGNOSIS — I10 Essential (primary) hypertension: Secondary | ICD-10-CM

## 2024-07-09 DIAGNOSIS — E782 Mixed hyperlipidemia: Secondary | ICD-10-CM | POA: Diagnosis not present

## 2024-07-10 LAB — LIPID PANEL
Cholesterol: 168 mg/dL (ref <200)
HDL: 33 mg/dL — ABNORMAL LOW (ref >=40)
LDL Cholesterol (Calc): 113 mg/dL — ABNORMAL HIGH
Non-HDL Cholesterol (Calc): 135 mg/dL — ABNORMAL HIGH (ref <130)
Total CHOL/HDL Ratio: 5.1 (calc) — ABNORMAL HIGH (ref <5.0)
Triglycerides: 113 mg/dL (ref <150)

## 2024-07-10 LAB — HEMOGLOBIN A1C
Hgb A1c MFr Bld: 6 % — ABNORMAL HIGH (ref <5.7)
Mean Plasma Glucose: 126 mg/dL
eAG (mmol/L): 7 mmol/L

## 2024-07-10 LAB — CBC WITH DIFFERENTIAL/PLATELET
Absolute Lymphocytes: 1871 {cells}/uL (ref 850–3900)
Absolute Monocytes: 647 {cells}/uL (ref 200–950)
Basophils Absolute: 31 {cells}/uL (ref 0–200)
Basophils Relative: 0.4 %
Eosinophils Absolute: 239 {cells}/uL (ref 15–500)
Eosinophils Relative: 3.1 %
HCT: 45.1 % (ref 38.5–50.0)
Hemoglobin: 14.5 g/dL (ref 13.2–17.1)
MCH: 29.6 pg (ref 27.0–33.0)
MCHC: 32.2 g/dL (ref 32.0–36.0)
MCV: 92 fL (ref 80.0–100.0)
MPV: 9.5 fL (ref 7.5–12.5)
Monocytes Relative: 8.4 %
Neutro Abs: 4913 {cells}/uL (ref 1500–7800)
Neutrophils Relative %: 63.8 %
Platelets: 235 Thousand/uL (ref 140–400)
RBC: 4.9 Million/uL (ref 4.20–5.80)
RDW: 12.9 % (ref 11.0–15.0)
Total Lymphocyte: 24.3 %
WBC: 7.7 Thousand/uL (ref 3.8–10.8)

## 2024-07-10 LAB — COMPLETE METABOLIC PANEL WITHOUT GFR
AG Ratio: 1.6 (calc) (ref 1.0–2.5)
ALT: 13 U/L (ref 9–46)
AST: 15 U/L (ref 10–35)
Albumin: 4.3 g/dL (ref 3.6–5.1)
Alkaline phosphatase (APISO): 73 U/L (ref 35–144)
BUN: 24 mg/dL (ref 7–25)
CO2: 31 mmol/L (ref 20–32)
Calcium: 9.8 mg/dL (ref 8.6–10.3)
Chloride: 103 mmol/L (ref 98–110)
Creat: 0.93 mg/dL (ref 0.70–1.28)
Globulin: 2.7 g/dL (ref 1.9–3.7)
Glucose, Bld: 94 mg/dL (ref 65–99)
Potassium: 4.7 mmol/L (ref 3.5–5.3)
Sodium: 141 mmol/L (ref 135–146)
Total Bilirubin: 0.6 mg/dL (ref 0.2–1.2)
Total Protein: 7 g/dL (ref 6.1–8.1)

## 2024-07-10 LAB — MICROALBUMIN / CREATININE URINE RATIO
Creatinine, Urine: 123 mg/dL (ref 20–320)
Microalb, Ur: <0.2 mg/dL

## 2024-07-13 ENCOUNTER — Ambulatory Visit: Payer: Self-pay | Admitting: Orthopedic Surgery

## 2024-07-15 ENCOUNTER — Ambulatory Visit (INDEPENDENT_AMBULATORY_CARE_PROVIDER_SITE_OTHER): Admitting: Orthopedic Surgery

## 2024-07-15 ENCOUNTER — Encounter: Payer: Self-pay | Admitting: Orthopedic Surgery

## 2024-07-15 VITALS — BP 124/66 | HR 51 | Temp 96.6°F | Resp 17 | Ht 72.0 in | Wt 227.4 lb

## 2024-07-15 DIAGNOSIS — R350 Frequency of micturition: Secondary | ICD-10-CM

## 2024-07-15 DIAGNOSIS — I10 Essential (primary) hypertension: Secondary | ICD-10-CM | POA: Diagnosis not present

## 2024-07-15 DIAGNOSIS — Z6830 Body mass index (BMI) 30.0-30.9, adult: Secondary | ICD-10-CM

## 2024-07-15 DIAGNOSIS — N401 Enlarged prostate with lower urinary tract symptoms: Secondary | ICD-10-CM

## 2024-07-15 DIAGNOSIS — M25641 Stiffness of right hand, not elsewhere classified: Secondary | ICD-10-CM

## 2024-07-15 DIAGNOSIS — E66811 Obesity, class 1: Secondary | ICD-10-CM

## 2024-07-15 DIAGNOSIS — E782 Mixed hyperlipidemia: Secondary | ICD-10-CM | POA: Diagnosis not present

## 2024-07-15 DIAGNOSIS — H6121 Impacted cerumen, right ear: Secondary | ICD-10-CM

## 2024-07-15 DIAGNOSIS — R7303 Prediabetes: Secondary | ICD-10-CM | POA: Diagnosis not present

## 2024-07-15 DIAGNOSIS — E6609 Other obesity due to excess calories: Secondary | ICD-10-CM

## 2024-07-15 LAB — PSA: PSA: 0.53 ng/mL (ref <=4.00)

## 2024-07-15 MED ORDER — TAMSULOSIN HCL 0.4 MG PO CAPS: 0.4000 mg | ORAL_CAPSULE | Freq: Every day | ORAL | 3 refills | Status: DC

## 2024-07-15 NOTE — Patient Instructions (Addendum)
 Schedule with cardiology  Try magnesium glycinate for leg neuropathy  Will try flomax  for urinary frequency> reduce caffeine intake to help with symptoms> no improvement> see urology  Debrox ear drops> apply 5 drops to right ear x 5 days> flush ears in shower > no improvement call to schedule ear flush  Schedule with Dr. Shari for right hand issues

## 2024-07-15 NOTE — Progress Notes (Unsigned)
 Careteam: Patient Care Team: Gil Greig BRAVO, NP as PCP - General (Adult Health Nurse Practitioner) Leigh, Elspeth SQUIBB, MD as Consulting Physician (Gastroenterology) [facility], [facility] (Specialist) Pietro Redell RAMAN, MD as Consulting Physician (Cardiology)  Seen by: Greig Gil, AGNP-C  PLACE OF SERVICE:  [facility]  Advanced Directive information Does Patient Have a Medical Advance Directive?: Yes, Type of Advance Directive: Healthcare Power of [place];Living will;Out of facility DNR (pink MOST or yellow form), Does patient want to make changes to medical advance directive?: No - Patient declined  Allergies  Allergen Reactions   Niacin Other (See Comments)    REACTION: flushing REACTION: flushing    Chief Complaint  Patient presents with   Medical Management of Chronic Issues    4 month follow up. Discuss the need for Shingrix vaccine, Covid Booster, DTAP vaccine, and Influenza vaccine.    Labs     Discuss labs     HPI: Patient is a 73 y.o. male seen today for medical management of chronic conditions.   Discussed the use of AI scribe software for clinical note transcription with the patient, who gave verbal consent to proceed.  History of Present Illness    He has been actively managing his weight and blood sugar levels, with a current weight of 227 pounds, down from 272 pounds at the start of his weight loss journey. His weight was 242 pounds in December and 228 pounds in April. His A1c levels have remained stable, with recent readings of 6.0, 5.9, and 6.1 over the past year. He is not taking any diabetes medication and aims to reduce his weight to 200 pounds, with an interim goal of 215 pounds. He occasionally consumes sweets, particularly cake, and snacks like pretzels at work. He has stopped using protein shakes for breakfast and drinks coffee in the morning.  He experiences difficulty emptying his bladder and has not yet scheduled an appointment with his urologist. He  drinks diet green Lipton tea and coffee, noting that he has stopped consuming ginger ale. He urinates at most once during the night.   He is currently taking lisinopril , blood pressure 124/66.   Recent LDL slightly elevated at 113. He consumes very little processed food but admits to eating bacon and deli [place] occasionally. Not on medication.   He has difficulty using his right hand, particularly the little finger, and cannot type effectively. He has seen a PA for trigger finger in the past and has had shots for it. He is considering seeing a hand specialist for further evaluation.      Review of Systems:  Review of Systems  Constitutional: Negative.   HENT: Negative.    Respiratory: Negative.    Cardiovascular: Negative.   Gastrointestinal:  Positive for heartburn.  Genitourinary:  Positive for frequency.  Musculoskeletal:  Positive for joint pain.  Neurological: Negative.   Psychiatric/Behavioral: Negative.      Past Medical History:  Diagnosis Date   Benign neoplasm of colon    Esophageal reflux    Esophagitis, unspecified    Fatty liver    Hemorrhoids    High cholesterol    Hypertension    Osteoarthrosis, unspecified whether generalized or localized, unspecified site    Other and unspecified hyperlipidemia    Skin cancer of nose    malignant skin cancer   Unspecified essential hypertension    Past Surgical History:  Procedure Laterality Date   BACK SURGERY  2002   COLONOSCOPY     ENDOSCOPIC LUMBAR DISCECTOMY  W/ LASER     KNEE SURGERY     Bilateral , REPLACED   LIVER ULTRASOUND  2024   microdisctomy  12/09/2000   Lumbar spine   Open knee cartilage repair     right total knee arthroplasty     skin cancer removal     WISDOM TOOTH EXTRACTION  12/09/1973   Social History:   reports that he quit smoking about 23 years ago. His smoking use included cigarettes. He started smoking about 53 years ago. He has a 75 pack-year smoking history. He has never used smokeless  tobacco. He reports current alcohol use. He reports that he does not use drugs.  Family History  Problem Relation Age of Onset   Hypertension Mother    Colon cancer Mother    Diabetes Mother    Heart failure Father    Colon cancer Paternal Grandfather    Esophageal cancer Neg Hx    Rectal cancer Neg Hx    Stomach cancer Neg Hx     Medications: Patient's Medications  New Prescriptions   No medications on file  Previous Medications   AMOXICILLIN (AMOXIL) 875 MG TABLET    Take 875 mg by mouth 2 (two) times daily. Dental work   ASPIRIN 81 MG TABLET    Take 81 mg by mouth daily.   BLOOD GLUCOSE MONITORING SUPPL DEVI    1 each by Does not apply route in the morning, at noon, and at bedtime. May substitute to any manufacturer covered by patient's insurance.   CLOBETASOL CREAM (TEMOVATE) 0.05 %    Apply 1 Application topically as needed.   CONTINUOUS GLUCOSE RECEIVER (FREESTYLE LIBRE 2 READER) DEVI    USE AS DIRECTED   CONTINUOUS GLUCOSE SENSOR (FREESTYLE LIBRE 2 SENSOR) MISC    1 Application by Does not apply route every 14 (fourteen) days.   HYDROCHLOROTHIAZIDE  (MICROZIDE ) 12.5 MG CAPSULE    TAKE 1 CAPSULE BY MOUTH EVERY DAY   LISINOPRIL  (ZESTRIL ) 40 MG TABLET    TAKE 1 TABLET BY MOUTH EVERY DAY   LORATADINE (CLARITIN REDITABS) 10 MG DISSOLVABLE TABLET    Take 10 mg by mouth daily.   OMEPRAZOLE  (PRILOSEC) 20 MG CAPSULE    TAKE 1 CAPSULE BY MOUTH EVERY DAY  Modified Medications   No medications on file  Discontinued Medications   No medications on file    Physical Exam:  Vitals:   07/15/24 1306  BP: 124/66  Pulse: (!) 51  Resp: 17  Temp: (!) 96.6 F (35.9 C)  SpO2: 98%  Weight: 227 lb 6.4 oz (103.1 kg)  Height: 6' (1.829 m)   Body mass index is 30.84 kg/m. Wt Readings from Last 3 Encounters:  07/15/24 227 lb 6.4 oz (103.1 kg)  03/11/24 228 lb (103.4 kg)  11/27/23 242 lb 12.8 oz (110.1 kg)    Physical Exam Vitals reviewed.  Constitutional:      General: He is not in  acute distress. HENT:     Head: Normocephalic.     Right Ear: There is impacted cerumen.     Left Ear: There is no impacted cerumen.     Nose: Nose normal.     Mouth/Throat:     Mouth: Mucous membranes are moist.  Eyes:     General:        Right eye: No discharge.        Left eye: No discharge.     Pupils: Pupils are equal, round, and reactive to light.  Cardiovascular:  Rate and Rhythm: Normal rate and regular rhythm.     Pulses: Normal pulses.     Heart sounds: Normal heart sounds.  Pulmonary:     Effort: Pulmonary effort is normal.     Breath sounds: Normal breath sounds.  Abdominal:     General: Bowel sounds are normal.     Palpations: Abdomen is soft.  Musculoskeletal:     Right hand: Deformity present. No swelling or tenderness. Decreased range of motion. Normal strength. Normal pulse.     Cervical back: Neck supple.     Right lower leg: No edema.     Left lower leg: No edema.  Skin:    General: Skin is warm.     Capillary Refill: Capillary refill takes less than 2 seconds.  Neurological:     General: No focal deficit present.     Mental Status: He is alert and oriented to person, place, and time.  Psychiatric:        Mood and Affect: Mood normal.     Labs reviewed: Basic Metabolic Panel: Recent Labs    08/04/23 0833 03/08/24 0843 07/09/24 0920  NA 143 139 141  K 5.0 4.8 4.7  CL 103 102 103  CO2 26 29 31   GLUCOSE 106* 106* 94  BUN 16 18 24   CREATININE 1.01 1.07 0.93  CALCIUM  9.7 9.8 9.8  TSH 1.630  --   --    Liver Function Tests: Recent Labs    08/04/23 0833 07/09/24 0920  AST 18 15  ALT 23 13  ALKPHOS 92  --   BILITOT 0.4 0.6  PROT 7.0 7.0  ALBUMIN 4.2  --    No results for input(s): LIPASE, AMYLASE in the last 8760 hours. No results for input(s): AMMONIA in the last 8760 hours. CBC: Recent Labs    08/04/23 0833 07/09/24 0920  WBC 7.9 7.7  NEUTROABS  --  4,913  HGB 15.2 14.5  HCT 46.1 45.1  MCV 92 92.0  PLT 232 235    Lipid Panel: Recent Labs    08/04/23 0833 11/21/23 0922 07/09/24 0920  CHOL 178 148 168  HDL 28* 27* 33*  LDLCALC 95 96 113*  TRIG 328* 149 113  CHOLHDL 6.4* 5.5* 5.1*   TSH: Recent Labs    08/04/23 0833  TSH 1.630   A1C: Lab Results  Component Value Date   HGBA1C 6.0 (H) 07/09/2024     Assessment/Plan 1. Benign prostatic hyperplasia with urinary frequency (Primary) - followed by urology -  not emptying bladder & increased frequency - tamsulosin  (FLOMAX ) 0.4 MG CAPS capsule; Take 1 capsule (0.4 mg total) by mouth daily.  Dispense: 30 capsule; Refill: 3 - PSA> 0.53 07/15/2024  2. Prediabetes - A1c 6.0 07/09/2024 - diet controlled - continue weight loss efforts - eye exam 12/08/2023> [facility]  3. Essential hypertension - controlled with lisinopril  and HCTZ  4. Stiffness of right hand joint - 4 th and 5 th digit with stiffness and limited ROM - followed by [facility]  5. Class 1 obesity due to excess calories with serious comorbidity and body mass index (BMI) of 30.0 to 30.9 in adult - BMI 30.84 - discussed limiting portions sizes and snacking  - recommend exercise 150 min/week  6. Right ear impacted cerumen - debrox 5 gtts at bedtime x 5 days> no improvement> schedule ear lavage  7. Mixed hyperlipidemia - LDL 113 - discussed limiting fats and processed foods in diet - recheck in 3 months  Total time: 37 minutes. Greater than 50% of total time spent doing patient education regarding prediabetes, HTN, HLD, urinary frequency, cerumen impaction and weight loss including symptom/medication management.     Next appt: 10/14/2024  Greig Cluster, ELNITA  [facility] [phone]

## 2024-07-16 ENCOUNTER — Ambulatory Visit: Payer: Self-pay | Admitting: Orthopedic Surgery

## 2024-08-04 ENCOUNTER — Other Ambulatory Visit: Payer: Self-pay | Admitting: Cardiology

## 2024-08-04 ENCOUNTER — Other Ambulatory Visit: Payer: Self-pay | Admitting: Orthopedic Surgery

## 2024-08-04 DIAGNOSIS — E782 Mixed hyperlipidemia: Secondary | ICD-10-CM

## 2024-08-04 DIAGNOSIS — E1141 Type 2 diabetes mellitus with diabetic mononeuropathy: Secondary | ICD-10-CM

## 2024-08-17 ENCOUNTER — Encounter: Payer: Self-pay | Admitting: Gastroenterology

## 2024-08-17 ENCOUNTER — Ambulatory Visit: Admitting: Gastroenterology

## 2024-08-17 VITALS — BP 114/60 | HR 60 | Ht 69.0 in | Wt 225.1 lb

## 2024-08-17 DIAGNOSIS — K760 Fatty (change of) liver, not elsewhere classified: Secondary | ICD-10-CM

## 2024-08-17 DIAGNOSIS — Z860100 Personal history of colon polyps, unspecified: Secondary | ICD-10-CM

## 2024-08-17 DIAGNOSIS — R1032 Left lower quadrant pain: Secondary | ICD-10-CM | POA: Diagnosis not present

## 2024-08-17 DIAGNOSIS — Z860101 Personal history of adenomatous and serrated colon polyps: Secondary | ICD-10-CM | POA: Diagnosis not present

## 2024-08-17 NOTE — Patient Instructions (Signed)
 Continue to work on weight loss.  Please follow up in 1 year.  Thank you for entrusting me with your care and for choosing [facility], Dr. Elspeth Naval    _______________________________________________________  If your blood pressure at your visit was 140/90 or greater, please contact your primary care physician to follow up on this.  _______________________________________________________  If you are age 74 or older, your body mass index should be between 23-30. Your Body mass index is 33.25 kg/m. If this is out of the aforementioned range listed, please consider follow up with your Primary Care Provider.  If you are age 28 or younger, your body mass index should be between 19-25. Your Body mass index is 33.25 kg/m. If this is out of the aformentioned range listed, please consider follow up with your Primary Care Provider.   ________________________________________________________  The [facility] providers would like to encourage you to use MYCHART to communicate with providers for non-urgent requests or questions.  Due to long hold times on the telephone, sending your provider a message by [facility] may be a faster and more efficient way to get a response.  Please allow 48 business hours for a response.  Please remember that this is for non-urgent requests.  _______________________________________________________  [facility] is using a team-based approach to care.  Your team is made up of your doctor and two to three APPS. Our APPS (Nurse Practitioners and Physician Assistants) work with your physician to ensure care continuity for you. They are fully qualified to address your health concerns and develop a treatment plan. They communicate directly with your gastroenterologist to care for you. Seeing the Advanced Practice Practitioners on your physician's team can help you by facilitating care more promptly, often allowing for earlier appointments, access to  diagnostic testing, procedures, and other specialty referrals.

## 2024-08-17 NOTE — Progress Notes (Signed)
 HPI :  73 year old male here for reassessment of MASLD, obesity.  Recall he has had fatty liver noted on imaging since 2018 or so, has not drank any alcohol with any significance.  Previously his BMI was at 36 with weight around 270 pounds when I last saw him and he was having hard time losing weight.  His liver function testing had historically been normal, no family history of liver disease or cirrhosis.  After I saw him in July he had an elastography done showing fatty liver and Median kPa:  1.6, low risk for fibrosis.  Liver function testing checked in August, AST 15, ALT 13.  Normal alk phos and bilirubin.  Normal platelet count.  We had discussed referring him to a weight loss center at his last visit but he preferred to do this on his own.  He states he has really made some significant changes to his diet and has lost about 46 pounds since the last visit.  Currently weighing 225 pounds today and his BMI has reduced to 33.25.  His goal is to try to get under 200 pounds if possible.  He states he is been very strict with his diet.  He does enjoy drinking coffee, drinks about 5 cups/day.  He otherwise had a colonoscopy with me in July of last year, had a few benign polyps were removed.  Recall his mother had colon cancer diagnosed age 63s.  I had recommended a repeat colonoscopy in July 2029.  He strongly wishes to pursue that exam at that point in his life, he is not comfortable stopping surveillance currently.  Otherwise endorses some intermittent inguinal/groin pain, and inquires about that today   Prior work-up: Colonoscopy 06/06/2017 - 4 small polyps - 2 adenomas, diverticulosis in the left colon - recall at 4 years Colonoscopy 2012 - normal EGD 2006 - normal   Nuclear stress test 06/09/21 - The left ventricular ejection fraction is normal (55-65%). Nuclear stress EF: 56%. Inferoseptal and inferior hypokinesis. There was no ST segment deviation noted during stress. This is a low risk  study  US  elastography 06/18/23: IMPRESSION: ULTRASOUND RUQ: Mildly increased hepatic echotexture suggests steatosis.   ULTRASOUND HEPATIC ELASTOGRAPHY: Median kPa:  1.6 Diagnostic category:  < or = 5 kPa: high probability of being normal    Colonoscopy 06/25/23: - The perianal and digital rectal examinations were normal. - A 3 mm polyp was found in the cecum. The polyp was sessile. The polyp was removed with a cold snare. Resection and retrieval were complete. - Two sessile polyps were found in the transverse colon. The polyps were 3 to 4 mm in size. These polyps were removed with a cold snare. Resection and retrieval were complete. - A 4 mm polyp was found in the sigmoid colon. The polyp was sessile. The polyp was removed with a cold snare. Resection and retrieval were complete. - A 4 mm polyp was found in the recto-sigmoid colon. The polyp was sessile. The polyp was removed with a cold snare. Resection and retrieval were complete. - Internal hemorrhoids were found during retroflexion. The hemorrhoids were small. - The exam was otherwise without abnormality.  1. Surgical [P], colon, sigmoid and recto-sigmoid, polyp (2) - HYPERPLASTIC POLYP. - NO DYSPLASIA OR MALIGNANCY. - TUBULAR ADENOMA. - NO HIGH GRADE DYSPLASIA OR MALIGNANCY. 2. Surgical [P], colon, transverse and cecum, polyp (3) - TUBULAR ADENOMA(S). - NO HIGH GRADE DYSPLASIA OR MALIGNANCY. - POLYPOID FRAGMENT OF BENIGN COLONIC MUCOSA WITH HYPERPLASTIC CHANGE  Repeat in  5 years  Past Medical History:  Diagnosis Date   Benign neoplasm of colon    Esophageal reflux    Esophagitis, unspecified    Fatty liver    Hemorrhoids    High cholesterol    Hypertension    Osteoarthrosis, unspecified whether generalized or localized, unspecified site    Other and unspecified hyperlipidemia    Skin cancer of nose    malignant skin cancer   Unspecified essential hypertension      Past Surgical History:  Procedure Laterality Date    BACK SURGERY  2002   COLONOSCOPY     ENDOSCOPIC LUMBAR DISCECTOMY W/ LASER     KNEE SURGERY     Bilateral , REPLACED   LIVER ULTRASOUND  2024   microdisctomy  12/09/2000   Lumbar spine   Open knee cartilage repair     right total knee arthroplasty     skin cancer removal     WISDOM TOOTH EXTRACTION  12/09/1973   Family History  Problem Relation Age of Onset   Hypertension Mother    Colon cancer Mother    Diabetes Mother    Heart failure Father    Colon cancer Paternal Grandfather    Esophageal cancer Neg Hx    Rectal cancer Neg Hx    Stomach cancer Neg Hx    Social History   Tobacco Use   Smoking status: Former    Current packs/day: 0.00    Average packs/day: 2.5 packs/day for 30.0 years (75.0 ttl pk-yrs)    Types: Cigarettes    Start date: 02/12/1971    Quit date: 02/11/2001    Years since quitting: 23.5   Smokeless tobacco: Never  Vaping Use   Vaping status: Never Used  Substance Use Topics   Alcohol use: Yes    Comment: OCCASIONALLY   Drug use: Never   Current Outpatient Medications  Medication Sig Dispense Refill   aspirin 81 MG tablet Take 81 mg by mouth daily.     clobetasol cream (TEMOVATE) 0.05 % Apply 1 Application topically as needed.     hydrochlorothiazide  (MICROZIDE ) 12.5 MG capsule TAKE 1 CAPSULE BY MOUTH EVERY DAY 90 capsule 2   lisinopril  (ZESTRIL ) 40 MG tablet TAKE 1 TABLET BY MOUTH EVERY DAY 90 tablet 2   loratadine (CLARITIN REDITABS) 10 MG dissolvable tablet Take 10 mg by mouth daily.     omeprazole  (PRILOSEC) 20 MG capsule TAKE 1 CAPSULE BY MOUTH EVERY DAY 90 capsule 0   tamsulosin  (FLOMAX ) 0.4 MG CAPS capsule Take 1 capsule (0.4 mg total) by mouth daily. 30 capsule 3   Blood Glucose Monitoring Suppl DEVI 1 each by Does not apply route in the morning, at noon, and at bedtime. May substitute to any manufacturer covered by patient's insurance. (Patient not taking: Reported on 08/17/2024) 1 each 0   Continuous Glucose Receiver (FREESTYLE LIBRE 2 READER)  DEVI USE AS DIRECTED (Patient not taking: Reported on 08/17/2024) 1 each 0   Continuous Glucose Sensor (FREESTYLE LIBRE 2 SENSOR) MISC 1 Application by Does not apply route every 14 (fourteen) days. (Patient not taking: Reported on 08/17/2024) 2 each 11   No current facility-administered medications for this visit.   Allergies  Allergen Reactions   Niacin Other (See Comments)    REACTION: flushing REACTION: flushing     Review of Systems: All systems reviewed and negative except where noted in HPI.   Lab Results  Component Value Date   ALT 13 07/09/2024   AST 15 07/09/2024  ALKPHOS 92 08/04/2023   BILITOT 0.6 07/09/2024    Lab Results  Component Value Date   WBC 7.7 07/09/2024   HGB 14.5 07/09/2024   HCT 45.1 07/09/2024   MCV 92.0 07/09/2024   PLT 235 07/09/2024   Fibrosis 4 Score = 1.29  Fib-4 interpretation is not validated for people under 35 or over 73 years of age. However, scores under 2.0 are generally considered low risk.   Physical Exam: BP 114/60 (BP Location: Left Arm, Patient Position: Sitting, Cuff Size: Normal)   Pulse 60   Ht 5' 9 (1.753 m) Comment: height measured without shoes  Wt 225 lb 2 oz (102.1 kg)   BMI 33.25 kg/m  Constitutional: Pleasant,well-developed, male in no acute distress. Left inguinal area - no obvious hernia, could not reproduce symptoms Neurological: Alert and oriented to person place and time. Psychiatric: Normal mood and affect. Behavior is normal.   ASSESSMENT: 73 y.o. male here for assessment of the following  1. Metabolic dysfunction-associated steatotic liver disease (MASLD)   2. History of colon polyps   3. Left inguinal pain    History of MASLD with normal liver enzymes.  He elastography showed reassuring changes last year with low risk for fibrosis.  We discussed this entity, overall risks for fibrosis and cirrhosis over time.  He does not drink alcohol.  Mainstay of treatment is with weight loss, and he has done an  exceptional job over the past year with dieting on his own, has lost 46 pounds to date.  He plans on continuing to work on his diet and his goal is to weigh around 200lbs if he can.  Otherwise, he drinks significant mount of coffee daily which can help prevent fibrotic change and recommend he continue to do so as he tolerates.  He will see me yearly for this, elastography and fib 4 score are reassuring.  He has a history of colon polyps and family history of colon cancer, he did want to do at least 1 more colonoscopy prior to stopping further surveillance.  Due for next colonoscopy in 2029 if he is otherwise in good health.  Having intermittent left inguinal pain.  No obvious hernia on exam today.  This could very likely be musculoskeletal, he will monitor for now, if worsening, consider imaging to further evaluate for hernia.  PLAN: - continue to work on weight loss, doing a great job - continue routine coffee intake - yearly labs / fib4 - consider fibroscan in next few years - colonoscopy 5 years from his last exam - suspect musculoskeletal pain, no obvious hernia but if persists consider imaging  Marcey Naval, MD [facility]

## 2024-09-08 ENCOUNTER — Other Ambulatory Visit: Payer: Self-pay | Admitting: Gastroenterology

## 2024-09-08 DIAGNOSIS — D485 Neoplasm of uncertain behavior of skin: Secondary | ICD-10-CM | POA: Diagnosis not present

## 2024-09-08 DIAGNOSIS — L821 Other seborrheic keratosis: Secondary | ICD-10-CM | POA: Diagnosis not present

## 2024-09-08 DIAGNOSIS — L814 Other melanin hyperpigmentation: Secondary | ICD-10-CM | POA: Diagnosis not present

## 2024-09-08 DIAGNOSIS — D1801 Hemangioma of skin and subcutaneous tissue: Secondary | ICD-10-CM | POA: Diagnosis not present

## 2024-10-14 ENCOUNTER — Encounter: Payer: Self-pay | Admitting: Orthopedic Surgery

## 2024-10-14 ENCOUNTER — Other Ambulatory Visit: Payer: Self-pay | Admitting: Orthopedic Surgery

## 2024-10-14 ENCOUNTER — Ambulatory Visit (INDEPENDENT_AMBULATORY_CARE_PROVIDER_SITE_OTHER): Payer: Self-pay | Admitting: Orthopedic Surgery

## 2024-10-14 VITALS — BP 108/60 | HR 70 | Temp 98.5°F | Ht 69.0 in | Wt 225.6 lb

## 2024-10-14 DIAGNOSIS — I10 Essential (primary) hypertension: Secondary | ICD-10-CM

## 2024-10-14 DIAGNOSIS — E11628 Type 2 diabetes mellitus with other skin complications: Secondary | ICD-10-CM

## 2024-10-14 DIAGNOSIS — N401 Enlarged prostate with lower urinary tract symptoms: Secondary | ICD-10-CM

## 2024-10-14 DIAGNOSIS — Z6830 Body mass index (BMI) 30.0-30.9, adult: Secondary | ICD-10-CM

## 2024-10-14 DIAGNOSIS — R7303 Prediabetes: Secondary | ICD-10-CM | POA: Diagnosis not present

## 2024-10-14 DIAGNOSIS — R350 Frequency of micturition: Secondary | ICD-10-CM

## 2024-10-14 DIAGNOSIS — E66811 Obesity, class 1: Secondary | ICD-10-CM

## 2024-10-14 DIAGNOSIS — E6609 Other obesity due to excess calories: Secondary | ICD-10-CM

## 2024-10-14 DIAGNOSIS — Z1211 Encounter for screening for malignant neoplasm of colon: Secondary | ICD-10-CM | POA: Diagnosis not present

## 2024-10-14 MED ORDER — BLOOD GLUCOSE MONITORING SUPPL DEVI: 1.0000 | Freq: Three times a day (TID) | Status: AC

## 2024-10-14 MED ORDER — TAMSULOSIN HCL 0.4 MG PO CAPS: 0.4000 mg | ORAL_CAPSULE | Freq: Every day | ORAL | 1 refills | Status: DC

## 2024-10-14 NOTE — Patient Instructions (Signed)
 Fasting labs prior to next visit> no food 12 hours before lab draw> water and black coffee morning of are ok to drink

## 2024-10-14 NOTE — Progress Notes (Unsigned)
 Careteam: Patient Care Team: Gil Greig BRAVO, NP as PCP - General (Adult Health Nurse Practitioner) Armbruster, Elspeth SQUIBB, MD as Consulting Physician (Gastroenterology) Ortho, [facility] (Specialist) Pietro Redell RAMAN, MD as Consulting Physician (Cardiology)  Seen by: Greig Gil, AGNP-C  PLACE OF SERVICE:  [facility]  Advanced Directive information    Allergies  Allergen Reactions   Niacin Other (See Comments)    REACTION: flushing REACTION: flushing    Chief Complaint  Patient presents with   Follow-up    3 month follow up     HPI: Patient is a 73 y.o. male seen today for medical management of chronic conditions.   Discussed the use of AI scribe software for clinical note transcription with the patient, who gave verbal consent to proceed.  History of Present Illness   [name] is a 73 year old male with prediabetes who presents for follow-up and A1c check.  He is monitoring his prediabetes and checking his A1c levels. His last A1c three months ago was 6.0. He has been actively managing his weight, which has decreased from 272 pounds to 225 pounds, although he notes a recent plateau. He aims to reduce his weight below 200 pounds by the start of the new year. His dietary habits include eating two meals a day with controlled portion sizes and occasionally practicing intermittent fasting. He consumes minimal sweets or sodas and rarely drinks alcohol.  He is currently taking Flomax , which is effective, and he may need a refill soon. He has not been checking his blood sugar levels recently but maintains a diet low in sweets.  In terms of past medical history, he had a colonoscopy in 2024 and is due for a follow-up in July 2029. He had an EKG last year. He underwent an eye procedure last December due to pressure in his eyes and plans to have a follow-up eye exam this December. He has a family history of cancer on both sides, prompting vigilance with screenings.  No skin  breakdown, sores on feet, or concerns with skin. His toenails are in good condition. His hands are often scratched and banged due to work.      Review of Systems:  Review of Systems  Constitutional: Negative.   HENT: Negative.    Eyes: Negative.   Respiratory: Negative.    Cardiovascular: Negative.   Gastrointestinal: Negative.   Genitourinary: Negative.   Musculoskeletal: Negative.   Neurological: Negative.   Psychiatric/Behavioral: Negative.      Past Medical History:  Diagnosis Date   Benign neoplasm of colon    Esophageal reflux    Esophagitis, unspecified    Fatty liver    Hemorrhoids    High cholesterol    Hypertension    Osteoarthrosis, unspecified whether generalized or localized, unspecified site    Other and unspecified hyperlipidemia    Skin cancer of nose    malignant skin cancer   Unspecified essential hypertension    Past Surgical History:  Procedure Laterality Date   BACK SURGERY  2002   COLONOSCOPY     ENDOSCOPIC LUMBAR DISCECTOMY W/ LASER     KNEE SURGERY     Bilateral , REPLACED   LIVER ULTRASOUND  2024   microdisctomy  12/09/2000   Lumbar spine   Open knee cartilage repair     right total knee arthroplasty     skin cancer removal     WISDOM TOOTH EXTRACTION  12/09/1973   Social History:   reports that he quit  smoking about 23 years ago. His smoking use included cigarettes. He started smoking about 53 years ago. He has a 75 pack-year smoking history. He has never used smokeless tobacco. He reports current alcohol use. He reports that he does not use drugs.  Family History  Problem Relation Age of Onset   Hypertension Mother    Colon cancer Mother    Diabetes Mother    Heart failure Father    Colon cancer Paternal Grandfather    Esophageal cancer Neg Hx    Rectal cancer Neg Hx    Stomach cancer Neg Hx     Medications: Patient's Medications  New Prescriptions   No medications on file  Previous Medications   ASPIRIN 81 MG TABLET     Take 81 mg by mouth daily.   BLOOD GLUCOSE MONITORING SUPPL DEVI    1 each by Does not apply route in the morning, at noon, and at bedtime. May substitute to any manufacturer covered by patient's insurance.   CLOBETASOL CREAM (TEMOVATE) 0.05 %    Apply 1 Application topically as needed.   CONTINUOUS GLUCOSE RECEIVER (FREESTYLE LIBRE 2 READER) DEVI    USE AS DIRECTED   CONTINUOUS GLUCOSE SENSOR (FREESTYLE LIBRE 2 SENSOR) MISC    1 Application by Does not apply route every 14 (fourteen) days.   HYDROCHLOROTHIAZIDE  (MICROZIDE ) 12.5 MG CAPSULE    TAKE 1 CAPSULE BY MOUTH EVERY DAY   LISINOPRIL  (ZESTRIL ) 40 MG TABLET    TAKE 1 TABLET BY MOUTH EVERY DAY   LORATADINE (CLARITIN REDITABS) 10 MG DISSOLVABLE TABLET    Take 10 mg by mouth daily.   OMEPRAZOLE  (PRILOSEC) 20 MG CAPSULE    TAKE 1 CAPSULE BY MOUTH EVERY DAY   TAMSULOSIN  (FLOMAX ) 0.4 MG CAPS CAPSULE    TAKE 1 CAPSULE BY MOUTH EVERY DAY  Modified Medications   No medications on file  Discontinued Medications   No medications on file    Physical Exam:  Vitals:   10/14/24 1308  BP: 108/60  Pulse: 70  Temp: 98.5 F (36.9 C)  SpO2: 98%  Weight: 225 lb 9.6 oz (102.3 kg)  Height: 5' 9 (1.753 m)   Body mass index is 33.32 kg/m. Wt Readings from Last 3 Encounters:  10/14/24 225 lb 9.6 oz (102.3 kg)  08/17/24 225 lb 2 oz (102.1 kg)  07/15/24 227 lb 6.4 oz (103.1 kg)    Physical Exam Vitals reviewed.  Constitutional:      General: He is not in acute distress. HENT:     Head: Normocephalic.  Eyes:     General:        Right eye: No discharge.        Left eye: No discharge.  Cardiovascular:     Rate and Rhythm: Normal rate and regular rhythm.     Pulses: Normal pulses.     Heart sounds: Normal heart sounds.  Pulmonary:     Effort: Pulmonary effort is normal.     Breath sounds: Normal breath sounds.  Abdominal:     General: Bowel sounds are normal. There is no distension.     Palpations: Abdomen is soft.     Tenderness: There  is no abdominal tenderness.  Musculoskeletal:     Cervical back: Neck supple.     Right lower leg: No edema.     Left lower leg: No edema.  Skin:    General: Skin is warm.     Capillary Refill: Capillary refill takes less than 2 seconds.  Neurological:     General: No focal deficit present.     Mental Status: He is alert and oriented to person, place, and time.  Psychiatric:        Mood and Affect: Mood normal.     Labs reviewed: Basic Metabolic Panel: Recent Labs    03/08/24 0843 07/09/24 0920  NA 139 141  K 4.8 4.7  CL 102 103  CO2 29 31  GLUCOSE 106* 94  BUN 18 24  CREATININE 1.07 0.93  CALCIUM  9.8 9.8   Liver Function Tests: Recent Labs    07/09/24 0920  AST 15  ALT 13  BILITOT 0.6  PROT 7.0   No results for input(s): LIPASE, AMYLASE in the last 8760 hours. No results for input(s): AMMONIA in the last 8760 hours. CBC: Recent Labs    07/09/24 0920  WBC 7.7  NEUTROABS 4,913  HGB 14.5  HCT 45.1  MCV 92.0  PLT 235   Lipid Panel: Recent Labs    11/21/23 0922 07/09/24 0920  CHOL 148 168  HDL 27* 33*  LDLCALC 96 113*  TRIG 149 113  CHOLHDL 5.5* 5.1*   TSH: No results for input(s): TSH in the last 8760 hours. A1C: Lab Results  Component Value Date   HGBA1C 6.0 (H) 07/09/2024     Assessment/Plan 1. Prediabetes (Primary) - improved> now 5.9> was 6.0 - diet controlled - Eye exam due 11/2024 - continue weight loss efforts to reduce diabetes  - Hemoglobin A1c  2. Colon cancer screening - colonoscopy due 06/2028  3. Benign prostatic hyperplasia with urinary frequency - improved with Flomax  - tamsulosin  (FLOMAX ) 0.4 MG CAPS capsule; Take 1 capsule (0.4 mg total) by mouth daily.  Dispense: 90 capsule; Refill: 1  4. Essential hypertension - controlled with hydrochlorothiazide  and lisinopril  - BUN/creat 24/0.93 10/14/2024  5. Class 1 obesity due to excess calories with serious comorbidity and body mass index (BMI) of 30.0 to 30.9  in adult - BMI 33.32 - improved  - past weight > 270 lbs - plateau with weight loss - recommend intermittent fasting - recommend exercise 150 min/week - recommend < 1800 calories/day    Total time: 31 minutes. Greater than 50% of total time spent doing patient education regarding health maintenance, HTN, prediabetes, HLD, weight loss and BPH including symptom/medication management.    Next appt: 11/25/2024  Greig Cluster, ELNITA  [facility] [phone]

## 2024-10-15 ENCOUNTER — Ambulatory Visit: Payer: Self-pay | Admitting: Orthopedic Surgery

## 2024-10-15 LAB — HEMOGLOBIN A1C
Hgb A1c MFr Bld: 5.9 % — ABNORMAL HIGH (ref <5.7)
Mean Plasma Glucose: 123 mg/dL
eAG (mmol/L): 6.8 mmol/L

## 2024-11-25 ENCOUNTER — Other Ambulatory Visit (HOSPITAL_COMMUNITY): Payer: Self-pay

## 2024-11-25 ENCOUNTER — Encounter: Payer: Self-pay | Admitting: Orthopedic Surgery

## 2024-11-25 ENCOUNTER — Ambulatory Visit: Payer: Medicare Other | Admitting: Orthopedic Surgery

## 2024-11-25 VITALS — BP 110/80 | HR 55 | Temp 98.2°F | Ht 69.0 in | Wt 222.4 lb

## 2024-11-25 DIAGNOSIS — Z0000 Encounter for general adult medical examination without abnormal findings: Secondary | ICD-10-CM | POA: Diagnosis not present

## 2024-11-25 DIAGNOSIS — N401 Enlarged prostate with lower urinary tract symptoms: Secondary | ICD-10-CM | POA: Diagnosis not present

## 2024-11-25 DIAGNOSIS — R350 Frequency of micturition: Secondary | ICD-10-CM

## 2024-11-25 MED ORDER — TAMSULOSIN HCL 0.4 MG PO CAPS
0.4000 mg | ORAL_CAPSULE | Freq: Every day | ORAL | 2 refills | Status: AC
  Filled 2024-11-25 (×2): qty 90, 90d supply, fill #0

## 2024-11-25 NOTE — Patient Instructions (Addendum)
 Mr. Isola,  Thank you for taking the time for your Medicare Wellness Visit. I appreciate your continued commitment to your health goals. Please review the care plan we discussed, and feel free to reach out if I can assist you further.  Please note that Annual Wellness Visits do not include a physical exam. Some assessments may be limited, especially if the visit was conducted virtually. If needed, we may recommend an in-person follow-up with your provider.  Ongoing Care Seeing your primary care provider every 3 to 6 months helps us  monitor your health and provide consistent, personalized care.   Referrals If a referral was made during today's visit and you haven't received any updates within two weeks, please contact the referred provider directly to check on the status.  Recommended Screenings:  Health Maintenance  Topic Date Due   Medicare Annual Wellness Visit  11/19/2024   Zoster (Shingles) Vaccine (1 of 2) 12/08/2024*   COVID-19 Vaccine (3 - Pfizer risk series) 12/11/2024*   Flu Shot  03/08/2025*   DTaP/Tdap/Td vaccine (1 - Tdap) 10/14/2025*   Yearly kidney function blood test for diabetes  07/09/2025   Yearly kidney health urinalysis for diabetes  07/09/2025   Colon Cancer Screening  06/24/2028   Pneumococcal Vaccine for age over 7  Completed   Hepatitis C Screening  Completed   Meningitis B Vaccine  Aged Out  *Topic was postponed. The date shown is not the original due date.       11/25/2024    1:04 PM  Advanced Directives  Does Patient Have a Medical Advance Directive? Yes  Type of Estate Agent of [place];Living will  Copy of Healthcare Power of Attorney in Chart? No - copy requested    Vision: Annual vision screenings are recommended for early detection of glaucoma, cataracts, and diabetic retinopathy. These exams can also reveal signs of chronic conditions such as diabetes and high blood pressure.  Dental: Annual dental screenings help  detect early signs of oral cancer, gum disease, and other conditions linked to overall health, including heart disease and diabetes.  Please see the attached documents for additional preventive care recommendations.   Recommend Shingrix vaccine and Tdap ( tetanus)

## 2024-11-25 NOTE — Progress Notes (Signed)
 Chief Complaint  Patient presents with   Medicare Wellness     Subjective:   [name] is a 73 y.o. male who presents for a Welcome to Medicare Exam.   Visit info / Clinical Intake: Medicare Wellness Visit Type:: Initial Annual Wellness Visit Persons participating in visit and providing information:: patient Medicare Wellness Visit Mode:: In-person (required for WTM) Interpreter Needed?: No Pre-visit prep was completed: no AWV questionnaire completed by patient prior to visit?: no Living arrangements:: lives with spouse/significant other Patient's Overall Health Status Rating: excellent Typical amount of pain: some Does pain affect daily life?: no Are you currently prescribed opioids?: no  Dietary Habits and Nutritional Risks How many meals a day?: 2 Eats fruit and vegetables daily?: yes Most meals are obtained by: eating out In the last 2 weeks, have you had any of the following?: none Diabetic:: no  Functional Status Activities of Daily Living (to include ambulation/medication): Independent Ambulation: Independent Medication Administration: Independent Home Management (perform basic housework or laundry): Independent Manage your own finances?: yes Primary transportation is: driving Concerns about vision?: no *vision screening is required for WTM* Concerns about hearing?: no  Fall Screening Falls in the past year?: 0 Number of falls in past year: 0 Was there an injury with Fall?: 0 Fall Risk Category Calculator: 0 Patient Fall Risk Level: Low Fall Risk  Fall Risk Patient at Risk for Falls Due to: No Fall Risks Fall risk Follow up: Falls evaluation completed  Home and Transportation Safety: All rugs have non-skid backing?: yes All stairs or steps have railings?: yes Grab bars in the bathtub or shower?: (!) no Have non-skid surface in bathtub or shower?: yes Good home lighting?: yes Regular seat belt use?: (!) no Hospital stays in the last year::  no  Cognitive Assessment Difficulty concentrating, remembering, or making decisions? : no Will 6CIT or Mini Cog be Completed: yes What year is it?: 0 points What month is it?: 0 points Give patient an address phrase to remember (5 components): [address] [place] About what time is it?: 0 points Count backwards from 20 to 1: 0 points Say the months of the year in reverse: 0 points Repeat the address phrase from earlier: 0 points 6 CIT Score: 0 points  Advance Directives (For Healthcare) Does Patient Have a Medical Advance Directive?: Yes Does patient want to make changes to medical advance directive?: No - Patient declined Type of Advance Directive: Healthcare Power of [place]; Living will Copy of Healthcare Power of Attorney in Chart?: No - copy requested Copy of Living Will in Chart?: No - copy requested Out of facility DNR (pink MOST or yellow form) in Chart? (Ambulatory ONLY): No - copy requested  Reviewed/Updated  Reviewed/Updated: Reviewed All (Medical, Surgical, Family, Medications, Allergies, Care Teams, Patient Goals)    Allergies (verified) Niacin   Current Medications (verified) Outpatient Encounter Medications as of 11/25/2024  Medication Sig   aspirin 81 MG tablet Take 81 mg by mouth daily.   Blood Glucose Monitoring Suppl DEVI 1 each by Does not apply route in the morning, at noon, and at bedtime. May substitute to any manufacturer covered by patient's insurance.   hydrochlorothiazide  (MICROZIDE ) 12.5 MG capsule TAKE 1 CAPSULE BY MOUTH EVERY DAY   lisinopril  (ZESTRIL ) 40 MG tablet TAKE 1 TABLET BY MOUTH EVERY DAY   loratadine (CLARITIN REDITABS) 10 MG dissolvable tablet Take 10 mg by mouth daily.   omeprazole  (PRILOSEC) 20 MG capsule TAKE 1 CAPSULE BY MOUTH EVERY DAY  tamsulosin  (FLOMAX ) 0.4 MG CAPS capsule Take 1 capsule (0.4 mg total) by mouth daily.   Continuous Glucose Receiver (FREESTYLE LIBRE 2 READER) DEVI USE AS DIRECTED (Patient not  taking: Reported on 11/25/2024)   Continuous Glucose Sensor (FREESTYLE LIBRE 2 SENSOR) MISC 1 Application by Does not apply route every 14 (fourteen) days. (Patient not taking: Reported on 11/25/2024)   No facility-administered encounter medications on file as of 11/25/2024.    History: Past Medical History:  Diagnosis Date   Benign neoplasm of colon    Esophageal reflux    Esophagitis, unspecified    Fatty liver    Hemorrhoids    High cholesterol    Hypertension    Osteoarthrosis, unspecified whether generalized or localized, unspecified site    Other and unspecified hyperlipidemia    Skin cancer of nose    malignant skin cancer   Unspecified essential hypertension    Past Surgical History:  Procedure Laterality Date   BACK SURGERY  2002   COLONOSCOPY     ENDOSCOPIC LUMBAR DISCECTOMY W/ LASER     KNEE SURGERY     Bilateral , REPLACED   LIVER ULTRASOUND  2024   microdisctomy  12/09/2000   Lumbar spine   Open knee cartilage repair     right total knee arthroplasty     skin cancer removal     WISDOM TOOTH EXTRACTION  12/09/1973   Family History  Problem Relation Age of Onset   Hypertension Mother    Colon cancer Mother    Diabetes Mother    Heart failure Father    Colon cancer Paternal Grandfather    Esophageal cancer Neg Hx    Rectal cancer Neg Hx    Stomach cancer Neg Hx    Social History   Occupational History   Occupation: Employed    Comment: Self Employed--Financial   Tobacco Use   Smoking status: Former    Current packs/day: 0.00    Average packs/day: 2.5 packs/day for 30.0 years (75.0 ttl pk-yrs)    Types: Cigarettes    Start date: 02/12/1971    Quit date: 02/11/2001    Years since quitting: 23.8   Smokeless tobacco: Never  Vaping Use   Vaping status: Never Used  Substance and Sexual Activity   Alcohol use: Yes    Comment: OCCASIONALLY   Drug use: Never   Sexual activity: Not on file   Tobacco Counseling Counseling given: Not Answered  SDOH  Screenings   Food Insecurity: No Food Insecurity (11/25/2024)  Housing: Low Risk (11/25/2024)  Transportation Needs: No Transportation Needs (11/25/2024)  Utilities: Not At Risk (11/25/2024)  Alcohol Screen: Low Risk (11/20/2023)  Depression (PHQ2-9): Low Risk (11/25/2024)  Financial Resource Strain: Low Risk (11/20/2023)  Physical Activity: Inactive (11/25/2024)  Social Connections: Socially Integrated (11/25/2024)  Stress: No Stress Concern Present (11/25/2024)  Tobacco Use: Medium Risk (11/25/2024)  Health Literacy: Adequate Health Literacy (11/25/2024)   See flowsheets for full screening details  Depression Screen PHQ 2 & 9 Depression Scale- Over the past 2 weeks, how often have you been bothered by any of the following problems? Little interest or pleasure in doing things: 0 Feeling down, depressed, or hopeless (PHQ Adolescent also includes...irritable): 0 PHQ-2 Total Score: 0      Goals Addressed             This Visit's Progress    Weight (lb) < 200 lb (90.7 kg)   222 lb 6.4 oz (100.9 kg)    Weight (lb) <  200 lb (90.7 kg)   222 lb 6.4 oz (100.9 kg)            Objective:    Today's Vitals   11/25/24 1246  BP: 110/80  Pulse: (!) 55  Temp: 98.2 F (36.8 C)  SpO2: 99%  Weight: 222 lb 6.4 oz (100.9 kg)  Height: 5' 9 (1.753 m)   Body mass index is 32.84 kg/m.   Physical Exam    Hearing/Vision screen Hearing Screening - Comments:: No hearing issues Vision Screening - Comments:: Dr Wilma ([place]/ [facility] Immunizations and Health Maintenance Health Maintenance  Topic Date Due   Medicare Annual Wellness (AWV)  11/19/2024   Zoster Vaccines- Shingrix (1 of 2) 12/08/2024 (Originally 11/21/1970)   COVID-19 Vaccine (3 - Pfizer risk series) 12/11/2024 (Originally 03/02/2020)   Influenza Vaccine  03/08/2025 (Originally 07/09/2024)   DTaP/Tdap/Td (1 - Tdap) 10/14/2025 (Originally 11/21/1970)   Diabetic kidney evaluation - eGFR measurement  07/09/2025    Diabetic kidney evaluation - Urine ACR  07/09/2025   Colonoscopy  06/24/2028   Pneumococcal Vaccine: 50+ Years  Completed   Hepatitis C Screening  Completed   Meningococcal B Vaccine  Aged Out    EKG: normal EKG, normal sinus rhythm, unchanged from previous tracings, normal sinus rhythm     Assessment/Plan:  This is a routine wellness examination for [name].  Patient Care Team: Gil Greig BRAVO, NP as PCP - General (Adult Health Nurse Practitioner) Armbruster, Elspeth SQUIBB, MD as Consulting Physician (Gastroenterology) Ortho, [facility] (Specialist) Pietro Redell RAMAN, MD as Consulting Physician (Cardiology)  I have personally reviewed and noted the following in the patients chart:   Medical and social history Use of alcohol, tobacco or illicit drugs  Current medications and supplements including opioid prescriptions. Functional ability and status Nutritional status Physical activity Advanced directives List of other physicians Hospitalizations, surgeries, and ER visits in previous 12 months Vitals Screenings to include cognitive, depression, and falls Referrals and appointments  No orders of the defined types were placed in this encounter.  In addition, I have reviewed and discussed with patient certain preventive protocols, quality metrics, and best practice recommendations. A written personalized care plan for preventive services as well as general preventive health recommendations were provided to patient.   Greig BRAVO Gil, NP   11/25/2024   No follow-ups on file.

## 2024-12-09 ENCOUNTER — Other Ambulatory Visit: Payer: Self-pay | Admitting: Gastroenterology

## 2024-12-11 ENCOUNTER — Encounter: Payer: Self-pay | Admitting: Orthopedic Surgery

## 2024-12-13 ENCOUNTER — Other Ambulatory Visit (HOSPITAL_COMMUNITY): Payer: Self-pay

## 2024-12-13 ENCOUNTER — Other Ambulatory Visit: Payer: Self-pay

## 2024-12-13 MED ORDER — OMEPRAZOLE 20 MG PO CPDR
20.0000 mg | DELAYED_RELEASE_CAPSULE | Freq: Every day | ORAL | 1 refills | Status: AC
  Filled 2024-12-13 – 2024-12-14 (×2): qty 90, 90d supply, fill #0

## 2024-12-13 MED ORDER — HYDROCHLOROTHIAZIDE 12.5 MG PO CAPS
12.5000 mg | ORAL_CAPSULE | Freq: Every day | ORAL | 1 refills | Status: AC
  Filled 2024-12-13 – 2024-12-14 (×2): qty 90, 90d supply, fill #0

## 2024-12-13 MED ORDER — LISINOPRIL 40 MG PO TABS
40.0000 mg | ORAL_TABLET | Freq: Every day | ORAL | 1 refills | Status: AC
  Filled 2024-12-13: qty 90, 90d supply, fill #0

## 2024-12-14 ENCOUNTER — Other Ambulatory Visit (HOSPITAL_COMMUNITY): Payer: Self-pay

## 2024-12-28 ENCOUNTER — Other Ambulatory Visit: Payer: Self-pay

## 2025-04-14 ENCOUNTER — Other Ambulatory Visit

## 2025-04-21 ENCOUNTER — Ambulatory Visit: Admitting: Orthopedic Surgery

## 2025-12-15 ENCOUNTER — Ambulatory Visit: Admitting: Orthopedic Surgery
